# Patient Record
Sex: Male | Born: 1970 | Race: White | Hispanic: No | Smoking: Current every day smoker
Health system: Southern US, Community
[De-identification: ages and names within clinical notes are randomized; demographics above are authoritative.]

## PROBLEM LIST (undated history)

## (undated) DIAGNOSIS — J40 Bronchitis, not specified as acute or chronic: Secondary | ICD-10-CM

## (undated) DIAGNOSIS — G8929 Other chronic pain: Secondary | ICD-10-CM

## (undated) DIAGNOSIS — Z789 Other specified health status: Secondary | ICD-10-CM

## (undated) DIAGNOSIS — R112 Nausea with vomiting, unspecified: Secondary | ICD-10-CM

## (undated) DIAGNOSIS — K219 Gastro-esophageal reflux disease without esophagitis: Secondary | ICD-10-CM

## (undated) DIAGNOSIS — Z7289 Other problems related to lifestyle: Secondary | ICD-10-CM

## (undated) HISTORY — PX: OTHER SURGICAL HISTORY: SHX169

## (undated) HISTORY — DX: Gastro-esophageal reflux disease without esophagitis: K21.9

## (undated) HISTORY — DX: Bronchitis, not specified as acute or chronic: J40

---

## 2011-02-20 ENCOUNTER — Other Ambulatory Visit: Payer: Self-pay | Admitting: Occupational Medicine

## 2011-02-20 ENCOUNTER — Ambulatory Visit: Payer: Self-pay

## 2011-02-20 DIAGNOSIS — R52 Pain, unspecified: Secondary | ICD-10-CM

## 2015-10-08 ENCOUNTER — Encounter: Payer: Self-pay | Admitting: Family Medicine

## 2015-10-08 ENCOUNTER — Ambulatory Visit (INDEPENDENT_AMBULATORY_CARE_PROVIDER_SITE_OTHER): Payer: Self-pay | Admitting: Family Medicine

## 2015-10-08 VITALS — BP 104/88 | HR 77 | Temp 99.0°F | Resp 16 | Ht 71.5 in | Wt 207.0 lb

## 2015-10-08 DIAGNOSIS — J209 Acute bronchitis, unspecified: Secondary | ICD-10-CM

## 2015-10-08 MED ORDER — METHYLPREDNISOLONE ACETATE 80 MG/ML IJ SUSP
80.0000 mg | Freq: Once | INTRAMUSCULAR | Status: AC
  Administered 2015-10-08: 80 mg via INTRAMUSCULAR

## 2015-10-08 MED ORDER — AMOXICILLIN-POT CLAVULANATE 875-125 MG PO TABS: 1.0000 | ORAL_TABLET | Freq: Two times a day (BID) | ORAL | Status: DC

## 2015-10-08 NOTE — Progress Notes (Signed)
Patient ID: Ian Roy MRN: [id number], DOB: 1970-09-27, 45 y.o. Date of Encounter: 10/08/2015, 5:35 PM  By signing my name below, I, Judithe Modest, attest that this documentation has been prepared under the direction and in the presence of Robyn Haber, MD. Electronically Signed: Judithe Modest, ER Scribe. 10/08/2015. 5:29 PM.  Primary Physician: No primary care provider on file.  Chief Complaint  Patient presents with  . Cough    x 1 week with green mucus  . Wheezing   HPI: 45 y.o. year old male who presents with one week of cough, sneezing, fatigue, rhinorrhea, congestion, and wheezing for the last week. He states his sx started after working on his brother's house outside for several hours. Several years ago he had similar sx and was out of work for a month. He states he feels the same way that he did then. He denies chest pain. He denies fever. He is an every day smoker.   He works as a Quarry manager.   Past Medical History  Diagnosis Date  . Bronchitis      Home Meds: Prior to Admission medications   Not on File    Allergies: No Known Allergies  Social History   Social History  . Marital Status: Single    Spouse Name: N/A  . Number of Children: N/A  . Years of Education: N/A   Occupational History  . Not on file.   Social History Main Topics  . Smoking status: Current Every Day Smoker -- 1.00 packs/day for 14 years    Types: Cigarettes  . Smokeless tobacco: Not on file  . Alcohol Use: 0.0 oz/week    0 Standard drinks or equivalent per week     Comment: beer   . Drug Use: No  . Sexual Activity: Not on file   Other Topics Concern  . Not on file   Social History Narrative  . No narrative on file     Review of Systems: Constitutional: negative for chills, fever, night sweats, weight changes, or fatigue  HEENT: negative for vision changes, hearing loss, congestion, rhinorrhea, ST, epistaxis, or sinus pressure Cardiovascular: negative for chest  pain or palpitations Respiratory: negative for hemoptysis Abdominal: negative for abdominal pain, nausea, vomiting, diarrhea, or constipation Dermatological: negative for rash Neurologic: negative for headache, dizziness, or syncope All other systems reviewed and are otherwise negative with the exception to those above and in the HPI.   Physical Exam: Blood pressure 104/88, pulse 77, temperature 99 F (37.2 C), temperature source Oral, resp. rate 16, height 5' 11.5" (1.816 m), weight 207 lb (93.895 kg), SpO2 97 %., Body mass index is 28.47 kg/(m^2). General: Well developed, well nourished, in no acute distress. Head: Normocephalic, atraumatic, eyes without discharge, sclera non-icteric, nares are without discharge. Bilateral auditory canals clear, TM's are without perforation, pearly grey and translucent with reflective cone of light bilaterally. Oral cavity moist, posterior pharynx without exudate, erythema, peritonsillar abscess, or post nasal drip.  Neck: Supple. No thyromegaly. Full ROM. No lymphadenopathy. Lungs: Clear bilaterally to auscultation. Breathing is unlabored. Significant wheezing bilaterally Heart: RRR with S1 S2. No murmurs, rubs, or gallops appreciated. Abdomen: Soft, non-tender, non-distended with normoactive bowel sounds. No hepatomegaly. No rebound/guarding. No obvious abdominal masses. Msk:  Strength and tone normal for age. Extremities/Skin: Warm and dry. No clubbing or cyanosis. No edema. No rashes or suspicious lesions. Neuro: Alert and oriented X 3. Moves all extremities spontaneously. Gait is normal. CNII-XII grossly in tact. Psych:  Responds  to questions appropriately with a normal affect.     ASSESSMENT AND PLAN:  45 y.o. year old male with Acute bronchitis, unspecified organism - Plan: amoxicillin-clavulanate (AUGMENTIN) 875-125 MG tablet, methylPREDNISolone acetate (DEPO-MEDROL) injection 80 mg  This chart was scribed in my presence and reviewed by me  personally.   Signed, Robyn Haber, MD 10/08/2015 5:35 PM

## 2015-10-08 NOTE — Patient Instructions (Signed)

## 2015-10-18 ENCOUNTER — Ambulatory Visit: Payer: Self-pay | Admitting: Physician Assistant

## 2015-10-25 ENCOUNTER — Encounter: Payer: Self-pay | Admitting: Physician Assistant

## 2015-10-30 ENCOUNTER — Emergency Department (HOSPITAL_COMMUNITY): Payer: Self-pay

## 2015-10-30 ENCOUNTER — Encounter (HOSPITAL_COMMUNITY): Payer: Self-pay | Admitting: *Deleted

## 2015-10-30 ENCOUNTER — Emergency Department (HOSPITAL_COMMUNITY)
Admission: EM | Admit: 2015-10-30 | Discharge: 2015-10-30 | Disposition: A | Discharge status: Home / Self Care | Payer: Self-pay | Attending: Emergency Medicine | Admitting: Emergency Medicine

## 2015-10-30 DIAGNOSIS — F17210 Nicotine dependence, cigarettes, uncomplicated: Secondary | ICD-10-CM | POA: Insufficient documentation

## 2015-10-30 DIAGNOSIS — R1084 Generalized abdominal pain: Secondary | ICD-10-CM | POA: Insufficient documentation

## 2015-10-30 DIAGNOSIS — R109 Unspecified abdominal pain: Secondary | ICD-10-CM

## 2015-10-30 DIAGNOSIS — R197 Diarrhea, unspecified: Secondary | ICD-10-CM | POA: Insufficient documentation

## 2015-10-30 DIAGNOSIS — R112 Nausea with vomiting, unspecified: Secondary | ICD-10-CM | POA: Insufficient documentation

## 2015-10-30 HISTORY — DX: Other problems related to lifestyle: Z72.89

## 2015-10-30 HISTORY — DX: Other specified health status: Z78.9

## 2015-10-30 LAB — COMPREHENSIVE METABOLIC PANEL
ALT: 47 U/L (ref 17–63)
ANION GAP: 15 (ref 5–15)
AST: 41 U/L (ref 15–41)
Albumin: 4.7 g/dL (ref 3.5–5.0)
Alkaline Phosphatase: 54 U/L (ref 38–126)
BUN: 12 mg/dL (ref 6–20)
CALCIUM: 10 mg/dL (ref 8.9–10.3)
CHLORIDE: 96 mmol/L — AB (ref 101–111)
CO2: 24 mmol/L (ref 22–32)
CREATININE: 1.19 mg/dL (ref 0.61–1.24)
Glucose, Bld: 119 mg/dL — ABNORMAL HIGH (ref 65–99)
Potassium: 3.3 mmol/L — ABNORMAL LOW (ref 3.5–5.1)
SODIUM: 135 mmol/L (ref 135–145)
Total Bilirubin: 1.2 mg/dL (ref 0.3–1.2)
Total Protein: 8.6 g/dL — ABNORMAL HIGH (ref 6.5–8.1)

## 2015-10-30 LAB — URINALYSIS, ROUTINE W REFLEX MICROSCOPIC
Bilirubin Urine: NEGATIVE
Glucose, UA: NEGATIVE mg/dL
Hgb urine dipstick: NEGATIVE
Ketones, ur: 40 mg/dL — AB
LEUKOCYTES UA: NEGATIVE
Nitrite: NEGATIVE
PROTEIN: NEGATIVE mg/dL
pH: 8 (ref 5.0–8.0)

## 2015-10-30 LAB — CBC
HCT: 48.6 % (ref 39.0–52.0)
HEMOGLOBIN: 17.5 g/dL — AB (ref 13.0–17.0)
MCH: 32.3 pg (ref 26.0–34.0)
MCHC: 36 g/dL (ref 30.0–36.0)
MCV: 89.7 fL (ref 78.0–100.0)
PLATELETS: 260 10*3/uL (ref 150–400)
RBC: 5.42 MIL/uL (ref 4.22–5.81)
RDW: 12.9 % (ref 11.5–15.5)
WBC: 12.4 10*3/uL — AB (ref 4.0–10.5)

## 2015-10-30 LAB — LIPASE, BLOOD: LIPASE: 17 U/L (ref 11–51)

## 2015-10-30 LAB — ETHANOL

## 2015-10-30 MED ORDER — DIATRIZOATE MEGLUMINE & SODIUM 66-10 % PO SOLN
ORAL | Status: AC
  Administered 2015-10-30: 30 mL
  Filled 2015-10-30: qty 30

## 2015-10-30 MED ORDER — DICYCLOMINE HCL 10 MG/ML IM SOLN
20.0000 mg | Freq: Once | INTRAMUSCULAR | Status: AC
  Administered 2015-10-30: 20 mg via INTRAMUSCULAR
  Filled 2015-10-30: qty 2

## 2015-10-30 MED ORDER — DICYCLOMINE HCL 20 MG PO TABS: 20.0000 mg | ORAL_TABLET | Freq: Four times a day (QID) | ORAL | Status: DC | PRN

## 2015-10-30 MED ORDER — ONDANSETRON HCL 4 MG PO TABS: 4.0000 mg | ORAL_TABLET | Freq: Three times a day (TID) | ORAL | Status: DC | PRN

## 2015-10-30 MED ORDER — FAMOTIDINE IN NACL 20-0.9 MG/50ML-% IV SOLN
20.0000 mg | Freq: Once | INTRAVENOUS | Status: AC
  Administered 2015-10-30: 20 mg via INTRAVENOUS
  Filled 2015-10-30: qty 50

## 2015-10-30 MED ORDER — POTASSIUM CHLORIDE CRYS ER 20 MEQ PO TBCR
40.0000 meq | EXTENDED_RELEASE_TABLET | Freq: Once | ORAL | Status: AC
  Administered 2015-10-30: 40 meq via ORAL
  Filled 2015-10-30: qty 2

## 2015-10-30 MED ORDER — SODIUM CHLORIDE 0.9 % IV SOLN
INTRAVENOUS | Status: DC
  Administered 2015-10-30: 1000 mL via INTRAVENOUS

## 2015-10-30 MED ORDER — SODIUM CHLORIDE 0.9 % IV BOLUS (SEPSIS)
1000.0000 mL | Freq: Once | INTRAVENOUS | Status: AC
  Administered 2015-10-30: 1000 mL via INTRAVENOUS

## 2015-10-30 MED ORDER — METOCLOPRAMIDE HCL 5 MG/ML IJ SOLN
10.0000 mg | Freq: Once | INTRAMUSCULAR | Status: AC
  Administered 2015-10-30: 10 mg via INTRAVENOUS
  Filled 2015-10-30: qty 2

## 2015-10-30 MED ORDER — IOPAMIDOL (ISOVUE-300) INJECTION 61%
100.0000 mL | Freq: Once | INTRAVENOUS | Status: AC | PRN
  Administered 2015-10-30: 100 mL via INTRAVENOUS

## 2015-10-30 MED ORDER — FAMOTIDINE 20 MG PO TABS: 20.0000 mg | ORAL_TABLET | Freq: Two times a day (BID) | ORAL | Status: DC

## 2015-10-30 NOTE — ED Notes (Signed)
Pt c/o abd pain and emesis since 2300 last night, states abd pain off and on for months, diarrhea x 1 month per pt

## 2015-10-30 NOTE — Discharge Instructions (Signed)
Eat a bland diet, avoiding greasy, fatty, fried foods, as well as spicy and acidic foods or beverages.  Avoid eating within the hour or 2 before going to bed or laying down.  Also avoid teas, colas, coffee, chocolate, pepermint and spearment. Try to cut down (or quit) your alcohol intake.  May also take over the counter maalox/mylanta, as directed on packaging, as needed for discomfort.  Take the prescriptions as directed.  Call your regular medical doctor and the GI doctor on Monday to schedule a follow up appointment this week.  Return to the Emergency Department immediately if worsening.

## 2015-10-30 NOTE — ED Provider Notes (Signed)
CSN: [id number]     Arrival date & time 10/30/15  1450 History   First MD Initiated Contact with Patient 10/30/15 1511     Chief Complaint  Patient presents with  . Abdominal Pain      HPI  Pt was seen at 1520.  Per pt, c/o gradual onset and persistence of intermittent generalized abd "pain" for the past 6 months, worse over the past 1 to 2 months.  Has been associated with multiple intermittent episodes of N/V/D.  Describes the abd pain as "cramping," "bloating," and "aching." States he was on abx for dx bronchitis 3 weeks ago, but his symptoms began well before the course of antibiotics. Pt endorses daily etoh use. Pt has not sought medical treatment for his symptoms before today.  Denies fevers, no back pain, no rash, no CP/SOB, no black or blood in stools or emesis.      Past Medical History  Diagnosis Date  . Bronchitis   . Alcohol use ([place])     daily   History reviewed. No pertinent past surgical history.   Family History  Problem Relation Age of Onset  . Cancer Mother     breast  . Diabetes Mother   . Cancer Brother    Social History  Substance Use Topics  . Smoking status: Current Every Day Smoker -- 1.00 packs/day for 14 years    Types: Cigarettes  . Smokeless tobacco: None  . Alcohol Use: 0.0 oz/week    0 Standard drinks or equivalent per week     Comment: beer     Review of Systems ROS: Statement: All systems negative except as marked or noted in the HPI; Constitutional: Negative for fever and chills. ; ; Eyes: Negative for eye pain, redness and discharge. ; ; ENMT: Negative for ear pain, hoarseness, nasal congestion, sinus pressure and sore throat. ; ; Cardiovascular: Negative for chest pain, palpitations, diaphoresis, dyspnea and peripheral edema. ; ; Respiratory: Negative for cough, wheezing and stridor. ; ; Gastrointestinal: +N/V/D, abd pain. Negative for blood in stool, hematemesis, jaundice and rectal bleeding. . ; ; Genitourinary: Negative for dysuria, flank  pain and hematuria. ; ; Musculoskeletal: Negative for back pain and neck pain. Negative for swelling and trauma.; ; Skin: Negative for pruritus, rash, abrasions, blisters, bruising and skin lesion.; ; Neuro: Negative for headache, lightheadedness and neck stiffness. Negative for weakness, altered level of consciousness , altered mental status, extremity weakness, paresthesias, involuntary movement, seizure and syncope.     Allergies  Review of patient's allergies indicates no known allergies.  Home Medications   Prior to Admission medications   Medication Sig Start Date End Date Taking? Authorizing Provider  amoxicillin-clavulanate (AUGMENTIN) 875-125 MG tablet Take 1 tablet by mouth 2 (two) times daily. Patient not taking: Reported on 10/30/2015 10/08/15   Robyn Haber, MD   BP 120/80 mmHg  Pulse 88  Temp(Src) 98.3 F (36.8 C) (Oral)  Resp 18  Ht 6' (1.829 m)  Wt 205 lb (92.987 kg)  BMI 27.80 kg/m2  SpO2 100% Physical Exam  1525: Physical examination:  Nursing notes reviewed; Vital signs and O2 SAT reviewed;  Constitutional: Well developed, Well nourished, Well hydrated, In no acute distress; Head:  Normocephalic, atraumatic; Eyes: EOMI, PERRL, No scleral icterus; ENMT: Mouth and pharynx normal, Mucous membranes moist; Neck: Supple, Full range of motion, No lymphadenopathy; Cardiovascular: Regular rate and rhythm, No gallop; Respiratory: Breath sounds clear & equal bilaterally, No wheezes.  Speaking full sentences with ease, Normal respiratory effort/excursion;  Chest: Nontender, Movement normal; Abdomen: Soft, +diffuse tenderness to palp. No rebound or guarding. Nondistended, Normal bowel sounds; Genitourinary: No CVA tenderness; Extremities: Pulses normal, No tenderness, No edema, No calf edema or asymmetry.; Neuro: AA&Ox3, Major CN grossly intact.  Speech clear. No gross focal motor or sensory deficits in extremities.; Skin: Color normal, Warm, Dry.   ED Course  Procedures (including  critical care time) Labs Review  Imaging Review  I have personally reviewed and evaluated these images and lab results as part of my medical decision-making.   EKG Interpretation None      MDM  MDM Reviewed: previous chart, nursing note and vitals Reviewed previous: labs Interpretation: labs, x-ray and CT scan     Results for orders placed or performed during the hospital encounter of 10/30/15  Lipase, blood  Result Value Ref Range   Lipase 17 11 - 51 U/L  Comprehensive metabolic panel  Result Value Ref Range   Sodium 135 135 - 145 mmol/L   Potassium 3.3 (L) 3.5 - 5.1 mmol/L   Chloride 96 (L) 101 - 111 mmol/L   CO2 24 22 - 32 mmol/L   Glucose, Bld 119 (H) 65 - 99 mg/dL   BUN 12 6 - 20 mg/dL   Creatinine, Ser 1.19 0.61 - 1.24 mg/dL   Calcium 10.0 8.9 - 10.3 mg/dL   Total Protein 8.6 (H) 6.5 - 8.1 g/dL   Albumin 4.7 3.5 - 5.0 g/dL   AST 41 15 - 41 U/L   ALT 47 17 - 63 U/L   Alkaline Phosphatase 54 38 - 126 U/L   Total Bilirubin 1.2 0.3 - 1.2 mg/dL   GFR calc non Af Amer >60 >60 mL/min   GFR calc Af Amer >60 >60 mL/min   Anion gap 15 5 - 15  CBC  Result Value Ref Range   WBC 12.4 (H) 4.0 - 10.5 K/uL   RBC 5.42 4.22 - 5.81 MIL/uL   Hemoglobin 17.5 (H) 13.0 - 17.0 g/dL   HCT 48.6 39.0 - 52.0 %   MCV 89.7 78.0 - 100.0 fL   MCH 32.3 26.0 - 34.0 pg   MCHC 36.0 30.0 - 36.0 g/dL   RDW 12.9 11.5 - 15.5 %   Platelets 260 150 - 400 K/uL  Ethanol  Result Value Ref Range   Alcohol, Ethyl (B) <5 <5 mg/dL   Dg Chest 2 View 10/30/2015  CLINICAL DATA:  Epigastric pain for several months with nausea and vomiting for 1 today EXAM: CHEST  2 VIEW COMPARISON:  None. FINDINGS: The heart size and mediastinal contours are within normal limits. Both lungs are clear. The visualized skeletal structures are unremarkable. IMPRESSION: No active cardiopulmonary disease. Electronically Signed   By: Inez Catalina M.D.   On: 10/30/2015 17:26   Ct Abdomen Pelvis W Contrast 10/30/2015  CLINICAL  DATA:  Epigastric pain and diarrhea for several months. Nausea and vomiting since yesterday. EXAM: CT ABDOMEN AND PELVIS WITH CONTRAST TECHNIQUE: Multidetector CT imaging of the abdomen and pelvis was performed using the standard protocol following bolus administration of intravenous contrast. CONTRAST:  142mL ISOVUE-300 IOPAMIDOL (ISOVUE-300) INJECTION 61% COMPARISON:  None. FINDINGS: Lower chest:  No acute findings. Hepatobiliary: No masses or other significant abnormality. Gallbladder is unremarkable. Pancreas: No mass, inflammatory changes, or other significant abnormality. Spleen: Within normal limits in size and appearance. Adrenals/Urinary Tract: No masses identified. No evidence of hydronephrosis. Stomach/Bowel: Small hiatal hernia noted. No evidence of obstruction, inflammatory process, or abnormal fluid collections. Normal appendix  visualized. Vascular/Lymphatic: No pathologically enlarged lymph nodes. No evidence of abdominal aortic aneurysm. Reproductive: No mass or other significant abnormality. Other: None. Musculoskeletal:  No suspicious bone lesions identified. IMPRESSION: No acute findings within the abdomen or pelvis. Small hiatal hernia. Electronically Signed   By: Earle Gell M.D.   On: 10/30/2015 17:25    1815:  Potassium repleted PO. Pt has tol PO well while in the ED without N/V.  No stooling while in the ED.  Abd benign, VSS. Feels better and wants to go home now. Tx symptomatically, f/u GI MD. Dx and testing d/w pt and family.  Questions answered.  Verb understanding, agreeable to d/c home with outpt f/u.     Francine Graven, DO 11/03/15 1451

## 2015-11-02 ENCOUNTER — Ambulatory Visit: Payer: Self-pay | Admitting: Physician Assistant

## 2015-11-02 ENCOUNTER — Other Ambulatory Visit: Payer: Self-pay | Admitting: Physician Assistant

## 2015-11-02 ENCOUNTER — Encounter: Payer: Self-pay | Admitting: Internal Medicine

## 2015-11-02 ENCOUNTER — Encounter: Payer: Self-pay | Admitting: Physician Assistant

## 2015-11-02 VITALS — BP 112/68 | HR 70 | Temp 97.7°F | Ht 71.5 in | Wt 199.2 lb

## 2015-11-02 DIAGNOSIS — F1010 Alcohol abuse, uncomplicated: Secondary | ICD-10-CM | POA: Insufficient documentation

## 2015-11-02 DIAGNOSIS — K219 Gastro-esophageal reflux disease without esophagitis: Secondary | ICD-10-CM

## 2015-11-02 DIAGNOSIS — R197 Diarrhea, unspecified: Secondary | ICD-10-CM

## 2015-11-02 DIAGNOSIS — F17210 Nicotine dependence, cigarettes, uncomplicated: Secondary | ICD-10-CM | POA: Insufficient documentation

## 2015-11-02 DIAGNOSIS — Z131 Encounter for screening for diabetes mellitus: Secondary | ICD-10-CM

## 2015-11-02 DIAGNOSIS — Z13220 Encounter for screening for lipoid disorders: Secondary | ICD-10-CM

## 2015-11-02 DIAGNOSIS — R112 Nausea with vomiting, unspecified: Secondary | ICD-10-CM

## 2015-11-02 HISTORY — DX: Nicotine dependence, cigarettes, uncomplicated: F17.210

## 2015-11-02 HISTORY — DX: Alcohol abuse, uncomplicated: F10.10

## 2015-11-02 MED ORDER — PROMETHAZINE HCL 25 MG RE SUPP: 25.0000 mg | Freq: Four times a day (QID) | RECTAL | Status: DC | PRN

## 2015-11-02 MED ORDER — PROMETHAZINE HCL 25 MG PO TABS: 25.0000 mg | ORAL_TABLET | Freq: Three times a day (TID) | ORAL | Status: DC | PRN

## 2015-11-02 NOTE — Progress Notes (Signed)
BP 112/68 mmHg  Pulse 70  Temp(Src) 97.7 F (36.5 C)  Ht 5' 11.5" (1.816 m)  Wt 199 lb 3.2 oz (90.357 kg)  BMI 27.40 kg/m2  SpO2 97%   Subjective:    Patient ID: Ian Roy, male    DOB: 1971/06/05, 45 y.o.   MRN: [id number]  HPI: Ian Roy is a 45 y.o. male presenting on 11/02/2015 for New Patient (Initial Visit)   HPI  Pt's mother is in with him today  Pt started feeling bad in march. He says he was Dx with bronchitis.  Got steroid shot and antibiotic which he did not finish/only took for a few days due to stomach irritation (augmentin) .   He felt better until middle of this month when he got stomach problems.  He denies coughing.    He says he had no emesis from 6pm last night - 10 am today.  He drank gatorade this am and started again with the emesis.  Pt c/o diarrhea for a month.  He states this is constant and not intermitttent.  Number of daily episodes depends- usually 2-4.  Pt states he drinks 12pk beer/night.  This was nightly until he began with the stomach pain a month ago and since that time it has been "random",  based on how he feels.  Notes from recent ER visit reviewed including benign CT abd and CXR    Relevant past medical, surgical, family and social history reviewed and updated as indicated. Interim medical history since our last visit reviewed. Allergies and medications reviewed and updated.  CURRENT MEDS: Reviewed but pt hostile and not overly helpful.  Says I am to count his pills to see what he is taking.  Says the "f" medicine seems to help him the most  Review of Systems  Constitutional: Positive for chills, diaphoresis, appetite change and fatigue. Negative for fever and unexpected weight change.  HENT: Positive for congestion and sneezing. Negative for dental problem, drooling, ear pain, facial swelling, hearing loss, mouth sores, sore throat, trouble swallowing and voice change.   Eyes: Positive for redness and itching. Negative  for pain, discharge and visual disturbance.  Respiratory: Positive for cough, choking and shortness of breath. Negative for wheezing.   Cardiovascular: Negative for chest pain, palpitations and leg swelling.  Gastrointestinal: Positive for vomiting, abdominal pain and diarrhea. Negative for constipation and blood in stool.  Endocrine: Negative for cold intolerance, heat intolerance and polydipsia.  Genitourinary: Negative for dysuria, hematuria and decreased urine volume.  Musculoskeletal: Negative for back pain, arthralgias and gait problem.  Skin: Negative for rash.  Allergic/Immunologic: Negative for environmental allergies.  Neurological: Positive for headaches. Negative for seizures, syncope and light-headedness.  Hematological: Negative for adenopathy.  Psychiatric/Behavioral: Negative for suicidal ideas, dysphoric mood and agitation. The patient is not nervous/anxious.     Per HPI unless specifically indicated above     Objective:    BP 112/68 mmHg  Pulse 70  Temp(Src) 97.7 F (36.5 C)  Ht 5' 11.5" (1.816 m)  Wt 199 lb 3.2 oz (90.357 kg)  BMI 27.40 kg/m2  SpO2 97%  Wt Readings from Last 3 Encounters:  11/02/15 199 lb 3.2 oz (90.357 kg)  10/30/15 205 lb (92.987 kg)  10/08/15 207 lb (93.895 kg)    Physical Exam  Constitutional: He is oriented to person, place, and time. He appears well-developed and well-nourished.  Pt flailing around the room, all over the exam table and the entire room, retching repeatedly -although  emesis bag only has spit in it- no vomitus or bilious material- rather incessantly.  This mysteriously stops when the pt becomes interested in answering specifically a question more in depth.  The retching resumes after he looses focus on his answer.  HENT:  Head: Normocephalic and atraumatic.  Neck: Neck supple.  Cardiovascular: Normal rate and regular rhythm.   Pulmonary/Chest: Effort normal and breath sounds normal. He has no wheezes.  Abdominal: Soft.  Bowel sounds are normal. He exhibits no distension and no mass. There is no hepatosplenomegaly. There is no tenderness. There is no rebound and no guarding.  Lymphadenopathy:    He has no cervical adenopathy.  Neurological: He is alert and oriented to person, place, and time.  Skin: Skin is warm and dry.  Psychiatric: His affect is angry, labile and inappropriate. He is agitated and aggressive.  Vitals reviewed.       Assessment & Plan:   Encounter Diagnoses  Name Primary?  . Non-intractable vomiting with nausea, vomiting of unspecified type Yes  . Diarrhea, unspecified type   . Gastroesophageal reflux disease, esophagitis presence not specified   . Alcohol abuse   . Cigarette nicotine dependence without complication   . Screening cholesterol level   . Screening for diabetes mellitus     -Pt to get labs drawn this afternoon.  Electrolytes need to be checked. -he is given rx for phenergan and counseled to avoid driving on the medication and absolutely NO EtOH with this medication -sent container to check stool for c dif toxin -pt likely will need GI referral but will attempt to discuss this at f/u OV in the hopes that he will be more willing to sit and discuss -f/u OV 2 days

## 2015-11-02 NOTE — Patient Instructions (Signed)

## 2015-11-03 LAB — LIPID PANEL
CHOL/HDL RATIO: 3.6 ratio (ref <=5.0)
Cholesterol: 169 mg/dL (ref 125–200)
HDL: 47 mg/dL (ref >=40)
LDL Cholesterol: 61 mg/dL (ref <130)
TRIGLYCERIDES: 307 mg/dL — AB (ref <150)
VLDL: 61 mg/dL — ABNORMAL HIGH (ref <30)

## 2015-11-03 LAB — BASIC METABOLIC PANEL
BUN: 8 mg/dL (ref 7–25)
CALCIUM: 9.5 mg/dL (ref 8.6–10.3)
CO2: 22 mmol/L (ref 20–31)
CREATININE: 1.07 mg/dL (ref 0.60–1.35)
Chloride: 98 mmol/L (ref 98–110)
GLUCOSE: 97 mg/dL (ref 65–99)
POTASSIUM: 3.4 mmol/L — AB (ref 3.5–5.3)
Sodium: 137 mmol/L (ref 135–146)

## 2015-11-03 LAB — HEMOGLOBIN A1C
Hgb A1c MFr Bld: 5.4 % (ref <5.7)
MEAN PLASMA GLUCOSE: 108 mg/dL

## 2015-11-04 ENCOUNTER — Encounter: Payer: Self-pay | Admitting: Physician Assistant

## 2015-11-04 ENCOUNTER — Ambulatory Visit: Payer: Self-pay | Admitting: Physician Assistant

## 2015-11-04 VITALS — BP 106/78 | HR 87 | Temp 98.1°F | Ht 71.5 in

## 2015-11-04 DIAGNOSIS — K219 Gastro-esophageal reflux disease without esophagitis: Secondary | ICD-10-CM

## 2015-11-04 DIAGNOSIS — R110 Nausea: Secondary | ICD-10-CM

## 2015-11-04 DIAGNOSIS — R197 Diarrhea, unspecified: Secondary | ICD-10-CM

## 2015-11-04 DIAGNOSIS — F17210 Nicotine dependence, cigarettes, uncomplicated: Secondary | ICD-10-CM

## 2015-11-04 DIAGNOSIS — R112 Nausea with vomiting, unspecified: Secondary | ICD-10-CM

## 2015-11-04 DIAGNOSIS — F1010 Alcohol abuse, uncomplicated: Secondary | ICD-10-CM

## 2015-11-04 MED ORDER — PROMETHAZINE HCL 25 MG/ML IJ SOLN: 25.0000 mg | Freq: Once | INTRAMUSCULAR | Status: DC

## 2015-11-04 MED ORDER — PROMETHAZINE HCL 50 MG/ML IJ SOLN
25.0000 mg | Freq: Once | INTRAMUSCULAR | Status: AC
  Administered 2015-11-04: 25 mg via INTRAMUSCULAR

## 2015-11-04 MED ORDER — DICYCLOMINE HCL 20 MG PO TABS: 20.0000 mg | ORAL_TABLET | Freq: Four times a day (QID) | ORAL | Status: DC | PRN

## 2015-11-04 MED ORDER — PROMETHAZINE HCL 25 MG PO TABS: 25.0000 mg | ORAL_TABLET | Freq: Three times a day (TID) | ORAL | Status: DC | PRN

## 2015-11-04 NOTE — Patient Instructions (Addendum)
Get blood drawn on Monday Call Monday to let us know if you are improved

## 2015-11-04 NOTE — Progress Notes (Signed)
BP 106/78 mmHg  Pulse 87  Temp(Src) 98.1 F (36.7 C)  Ht 5' 11.5" (1.816 m)  SpO2 99%   Subjective:    Patient ID: Ian Roy, male    DOB: Nov 07, 1970, 45 y.o.   MRN: [id number]  HPI: Ian Roy is a 45 y.o. male presenting on 11/04/2015 for Emesis; Diarrhea; and Abdominal Pain   HPI    Pt with long-standing hx alcoholism- drinks 12 beer/day.  Now with "emesis" since Friday last week.   Pt is still retching same as he was on 11/02/15 (see office note) with episodes of note retching when he gets focused on talking/answering a question in depth followed by flailing around the exam room with dramatic retching.  No vomiting blood.  Pt did not turn in stool to lab as directed but brought it to office with him today.  Relevant past medical, surgical, family and social history reviewed and updated as indicated. Interim medical history since our last visit reviewed. Allergies and medications reviewed and updated.  Current outpatient prescriptions:  .  famotidine (PEPCID) 20 MG tablet, Take 1 tablet (20 mg total) by mouth 2 (two) times daily., Disp: 30 tablet, Rfl: 0 .  Phosphorated Carbohyd-Caff SYRP, Take by mouth daily as needed. Reported on 11/04/2015, Disp: , Rfl:  .  promethazine (PHENERGAN) 25 MG tablet, Take 1 tablet (25 mg total) by mouth every 8 (eight) hours as needed for nausea or vomiting., Disp: 10 tablet, Rfl: 0 .  promethazine (PROMETHEGAN) 25 MG suppository, Place 1 suppository (25 mg total) rectally every 6 (six) hours as needed for nausea or vomiting. (Patient not taking: Reported on 11/04/2015), Disp: 10 each, Rfl: 0  Current facility-administered medications:  .  promethazine (PHENERGAN) injection 25 mg, 25 mg, Intramuscular, Once, Soyla Dryer, PA-C  Review of Systems  Unable to perform ROS: Other  Gastrointestinal: Positive for nausea, vomiting, abdominal pain and diarrhea.    Per HPI unless specifically indicated above     Objective:    BP  106/78 mmHg  Pulse 87  Temp(Src) 98.1 F (36.7 C)  Ht 5' 11.5" (1.816 m)  SpO2 99%  Wt Readings from Last 3 Encounters:  11/02/15 199 lb 3.2 oz (90.357 kg)  10/30/15 205 lb (92.987 kg)  10/08/15 207 lb (93.895 kg)    Physical Exam  Constitutional: He is oriented to person, place, and time. He appears well-developed and well-nourished.  HENT:  Head: Normocephalic and atraumatic.  Neck: Neck supple.  Cardiovascular: Normal rate and regular rhythm.   Pulmonary/Chest: Effort normal and breath sounds normal. He has no wheezes.  Abdominal: Soft. Bowel sounds are normal. He exhibits no distension and no mass. There is no hepatosplenomegaly. There is no tenderness. There is no rigidity, no rebound and no guarding.  Distracting pt with questioning during exam allows for entirely benign abdominal exam with no tenderness or mass on deep palpation.  Prior to exam on table, pt retching violently with nothing in basin except for small amount of saliva.  During exam on table, no retching or effort to have "emesis"  Lymphadenopathy:    He has no cervical adenopathy.  Neurological: He is alert and oriented to person, place, and time.  Skin: Skin is warm and dry.  Psychiatric: His affect is inappropriate.  Pt flailing around exam room. Question histrionic behavior  Vitals reviewed.   Results for orders placed or performed in visit on 0000000  Basic Metabolic Panel (BMET)  Result Value Ref Range   Sodium  137 135 - 146 mmol/L   Potassium 3.4 (L) 3.5 - 5.3 mmol/L   Chloride 98 98 - 110 mmol/L   CO2 22 20 - 31 mmol/L   Glucose, Bld 97 65 - 99 mg/dL   BUN 8 7 - 25 mg/dL   Creat 1.07 0.60 - 1.35 mg/dL   Calcium 9.5 8.6 - 10.3 mg/dL  Lipid Profile  Result Value Ref Range   Cholesterol 169 125 - 200 mg/dL   Triglycerides 307 (H) <150 mg/dL   HDL 47 >=40 mg/dL   Total CHOL/HDL Ratio 3.6 <=5.0 Ratio   VLDL 61 (H) <30 mg/dL   LDL Cholesterol 61 <130 mg/dL  HgB A1c  Result Value Ref Range   Hgb  A1c MFr Bld 5.4 <5.7 %   Mean Plasma Glucose 108 mg/dL      Assessment & Plan:   Encounter Diagnoses  Name Primary?  . Nausea and vomiting, vomiting of unspecified type Yes  . Gastroesophageal reflux disease, esophagitis presence not specified   . Alcohol abuse   . Cigarette nicotine dependence without complication   . Diarrhea, unspecified type   . Nausea     -pt given phenergan 25mg  IM in office with no effect or change in retching behavior (pt's mother with him today- she is driving) -Gave [employer] discount application -Pt to take stool to lab for c dif testing -Recheck bmp Monday. -Refill phenergan given to pt -pt to notify office on Monday if he is improved.  If he is still retching,will need to be referred to GI -f/u appointment one month

## 2015-11-05 LAB — C. DIFFICILE GDH AND TOXIN A/B
C. DIFF TOXIN A/B: NOT DETECTED
C. difficile GDH: NOT DETECTED

## 2015-11-07 DIAGNOSIS — R1110 Vomiting, unspecified: Secondary | ICD-10-CM | POA: Insufficient documentation

## 2015-11-07 DIAGNOSIS — R197 Diarrhea, unspecified: Secondary | ICD-10-CM | POA: Insufficient documentation

## 2015-11-07 DIAGNOSIS — K219 Gastro-esophageal reflux disease without esophagitis: Secondary | ICD-10-CM | POA: Insufficient documentation

## 2015-11-08 ENCOUNTER — Encounter (HOSPITAL_COMMUNITY): Payer: Self-pay | Admitting: Emergency Medicine

## 2015-11-08 ENCOUNTER — Encounter: Payer: Self-pay | Admitting: Physician Assistant

## 2015-11-08 ENCOUNTER — Emergency Department (HOSPITAL_COMMUNITY): Payer: Self-pay

## 2015-11-08 ENCOUNTER — Emergency Department (HOSPITAL_COMMUNITY)
Admission: EM | Admit: 2015-11-08 | Discharge: 2015-11-08 | Disposition: A | Discharge status: Home / Self Care | Payer: Self-pay | Attending: Emergency Medicine | Admitting: Emergency Medicine

## 2015-11-08 DIAGNOSIS — K2970 Gastritis, unspecified, without bleeding: Secondary | ICD-10-CM | POA: Insufficient documentation

## 2015-11-08 DIAGNOSIS — R112 Nausea with vomiting, unspecified: Secondary | ICD-10-CM

## 2015-11-08 DIAGNOSIS — Z79899 Other long term (current) drug therapy: Secondary | ICD-10-CM | POA: Insufficient documentation

## 2015-11-08 DIAGNOSIS — F17210 Nicotine dependence, cigarettes, uncomplicated: Secondary | ICD-10-CM | POA: Insufficient documentation

## 2015-11-08 LAB — COMPREHENSIVE METABOLIC PANEL
ALT: 61 U/L (ref 17–63)
ANION GAP: 14 (ref 5–15)
AST: 43 U/L — ABNORMAL HIGH (ref 15–41)
Albumin: 4.7 g/dL (ref 3.5–5.0)
Alkaline Phosphatase: 49 U/L (ref 38–126)
BUN: 17 mg/dL (ref 6–20)
CHLORIDE: 97 mmol/L — AB (ref 101–111)
CO2: 28 mmol/L (ref 22–32)
Calcium: 10.2 mg/dL (ref 8.9–10.3)
Creatinine, Ser: 1.35 mg/dL — ABNORMAL HIGH (ref 0.61–1.24)
Glucose, Bld: 109 mg/dL — ABNORMAL HIGH (ref 65–99)
POTASSIUM: 3.1 mmol/L — AB (ref 3.5–5.1)
Sodium: 139 mmol/L (ref 135–145)
Total Bilirubin: 0.9 mg/dL (ref 0.3–1.2)
Total Protein: 8.5 g/dL — ABNORMAL HIGH (ref 6.5–8.1)

## 2015-11-08 LAB — CBC
HEMATOCRIT: 50.6 % (ref 39.0–52.0)
Hemoglobin: 17.9 g/dL — ABNORMAL HIGH (ref 13.0–17.0)
MCH: 32.1 pg (ref 26.0–34.0)
MCHC: 35.4 g/dL (ref 30.0–36.0)
MCV: 90.8 fL (ref 78.0–100.0)
PLATELETS: 350 10*3/uL (ref 150–400)
RBC: 5.57 MIL/uL (ref 4.22–5.81)
RDW: 12.7 % (ref 11.5–15.5)
WBC: 10.1 10*3/uL (ref 4.0–10.5)

## 2015-11-08 LAB — LIPASE, BLOOD: LIPASE: 16 U/L (ref 11–51)

## 2015-11-08 LAB — MAGNESIUM: Magnesium: 2.3 mg/dL (ref 1.7–2.4)

## 2015-11-08 MED ORDER — SCOPOLAMINE 1 MG/3DAYS TD PT72
1.0000 | MEDICATED_PATCH | TRANSDERMAL | Status: DC
  Administered 2015-11-08: 1.5 mg via TRANSDERMAL
  Filled 2015-11-08: qty 1

## 2015-11-08 MED ORDER — OMEPRAZOLE 20 MG PO CPDR: 20.0000 mg | DELAYED_RELEASE_CAPSULE | Freq: Two times a day (BID) | ORAL | Status: DC

## 2015-11-08 MED ORDER — SODIUM CHLORIDE 0.9 % IV BOLUS (SEPSIS)
2000.0000 mL | Freq: Once | INTRAVENOUS | Status: AC
  Administered 2015-11-08: 2000 mL via INTRAVENOUS

## 2015-11-08 MED ORDER — POTASSIUM CHLORIDE 10 MEQ/100ML IV SOLN
10.0000 meq | INTRAVENOUS | Status: AC
  Administered 2015-11-08 (×2): 10 meq via INTRAVENOUS
  Filled 2015-11-08 (×2): qty 100

## 2015-11-08 MED ORDER — ONDANSETRON HCL 4 MG/2ML IJ SOLN
4.0000 mg | Freq: Once | INTRAMUSCULAR | Status: AC
  Administered 2015-11-08: 4 mg via INTRAVENOUS
  Filled 2015-11-08: qty 2

## 2015-11-08 MED ORDER — SCOPOLAMINE 1 MG/3DAYS TD PT72
MEDICATED_PATCH | TRANSDERMAL | Status: AC
  Filled 2015-11-08: qty 1

## 2015-11-08 MED ORDER — PANTOPRAZOLE SODIUM 40 MG IV SOLR
40.0000 mg | Freq: Once | INTRAVENOUS | Status: AC
  Administered 2015-11-08: 40 mg via INTRAVENOUS
  Filled 2015-11-08: qty 40

## 2015-11-08 MED ORDER — DIPHENHYDRAMINE HCL 50 MG/ML IJ SOLN
25.0000 mg | Freq: Once | INTRAMUSCULAR | Status: AC
  Administered 2015-11-08: 25 mg via INTRAVENOUS
  Filled 2015-11-08: qty 1

## 2015-11-08 MED ORDER — HALOPERIDOL LACTATE 5 MG/ML IJ SOLN
2.0000 mg | Freq: Once | INTRAMUSCULAR | Status: AC
  Administered 2015-11-08: 2 mg via INTRAVENOUS
  Filled 2015-11-08: qty 1

## 2015-11-08 MED ORDER — ONDANSETRON 4 MG PO TBDP: 4.0000 mg | ORAL_TABLET | Freq: Three times a day (TID) | ORAL | Status: DC | PRN

## 2015-11-08 MED ORDER — MAGNESIUM SULFATE 2 GM/50ML IV SOLN
2.0000 g | Freq: Once | INTRAVENOUS | Status: AC
  Administered 2015-11-08: 2 g via INTRAVENOUS
  Filled 2015-11-08: qty 50

## 2015-11-08 MED ORDER — ONDANSETRON 4 MG PO TBDP
4.0000 mg | ORAL_TABLET | Freq: Once | ORAL | Status: AC
  Administered 2015-11-08: 4 mg via ORAL
  Filled 2015-11-08: qty 1

## 2015-11-08 MED ORDER — SCOPOLAMINE 1 MG/3DAYS TD PT72: 1.0000 | MEDICATED_PATCH | TRANSDERMAL | Status: DC

## 2015-11-08 NOTE — ED Notes (Signed)
Reminded patient that urine specimen is needed. Patient arrived at ED @ 1347 states that no one told him that is was required. States that he hasn't drank or ate anything in about a week. States that what little he did void was very orange in color and smelled.

## 2015-11-08 NOTE — ED Notes (Addendum)
Pt reports LT sided abdominal pain and vomiting x 2 weeks. Pt seen in ED over a week ago and was told to follow up with specialist. Pt reports specialist is booked for a month. States he cannot keep anything down. Pt reports decrease in bowel movements and urination over the past week.

## 2015-11-08 NOTE — Discharge Instructions (Signed)
Absolutely no alcohol, smoking, caffeine, or anti-inflammatory medicine such as aspirin/Motrin/Advil. Clear liquids only tonight. Slowly advance diet to solid foods after tolerating liquids for 24 hours.    Gastritis, Adult Gastritis is soreness and puffiness (inflammation) of the lining of the stomach. If you do not get help, gastritis can cause bleeding and sores (ulcers) in the stomach. HOME CARE   Only take medicine as told by your doctor.  If you were given antibiotic medicines, take them as told. Finish the medicines even if you start to feel better.  Drink enough fluids to keep your pee (urine) clear or pale yellow.  Avoid foods and drinks that make your problems worse. Foods you may want to avoid include:  Caffeine or alcohol.  Chocolate.  Mint.  Garlic and onions.  Spicy foods.  Citrus fruits, including oranges, lemons, or limes.  Food containing tomatoes, including sauce, chili, salsa, and pizza.  Fried and fatty foods.  Eat small meals throughout the day instead of large meals. GET HELP RIGHT AWAY IF:   You have black or dark red poop (stools).  You throw up (vomit) blood. It may look like coffee grounds.  You cannot keep fluids down.  Your belly (abdominal) pain gets worse.  You have a fever.  You do not feel better after 1 week.  You have any other questions or concerns. MAKE SURE YOU:   Understand these instructions.  Will watch your condition.  Will get help right away if you are not doing well or get worse.   This information is not intended to replace advice given to you by your health care provider. Make sure you discuss any questions you have with your health care provider.   Document Released: 12/13/2007 Document Revised: 09/18/2011 Document Reviewed: 08/09/2011 Elsevier Interactive Patient Education [employer].

## 2015-11-08 NOTE — ED Notes (Signed)
Assumed care of patient from [place], [place]. Pt awaiting radiology studies and results. Mother at side. Pt agressively loud, hyper verbal, encouraged to remain quiet while in the ED.

## 2015-11-08 NOTE — ED Notes (Signed)
Patient transported to X-ray 

## 2015-11-08 NOTE — Progress Notes (Signed)
Pt came in office stating he was not feeling better after being seen at our office on Thursday morning. Patient was advised to go to the [facility] Per [facility]rene [place] for treatment.

## 2015-11-08 NOTE — ED Notes (Signed)
Went to go give pt zofran. Pt states, "that shit don't work." Informed patient that he did not have to take medication. Pt stated, "can you give me a suppository." Stated that we did not have zofran suppositories. Pt requesting water. Denied at this time since patient is dry heaving.

## 2015-11-08 NOTE — ED Provider Notes (Signed)
CSN: [id number]     Arrival date & time 11/08/15  1341 History   First MD Initiated Contact with Patient 11/08/15 1820     Chief Complaint  Patient presents with  . Abdominal Pain     HPI  History evaluation of persistent vomiting.  He relates his symptoms to a visit to urgent care for bronchitis on March 31. It sounds like he was rather insistent upon given antibiotics, and he was given Augmentin for 10 days. He states within a few days he had some abdominal discomfort. States "that Shit tore me up". Further states that this felt like it was making him cramp nauseated and he had no appetite. Seen here April 22. Normal CT scan and laboratory testing was treated for his nausea and vomiting with antiemetics and proton pump inhibitors. Discharge with Pepcid, Bentyl, and Zofran. Has had persistent symptoms since that time. Relates these lost 10 pounds. [facility] Has had outpatient testing including a negative C. difficile, and pending Giardia test.  States he has continued episodes of waves of discomfort with nausea and epigastric pain. States even Jell-O is coming up. His of had a bowel movement other than "small amounts" for more than a week. Was a previous daily alcohol user, has not had any alcohol since his evaluation here 4/22. Denies marijuana use. Daily smoker. Does not use anti-inflammatories.  Past Medical History  Diagnosis Date  . Bronchitis   . Alcohol use (HCC)     daily  . GERD (gastroesophageal reflux disease)    History reviewed. No pertinent past surgical history. Family History  Problem Relation Age of Onset  . Cancer Mother     breast  . Diabetes Mother   . Cancer Maternal Aunt    Social History  Substance Use Topics  . Smoking status: Current Every Day Smoker -- 1.00 packs/day for 14 years    Types: Cigarettes  . Smokeless tobacco: Never Used  . Alcohol Use: 50.4 oz/week    84 Cans of beer, 0 Standard drinks or equivalent per week   Comment: 12 beer / day    Review of Systems  Constitutional: Negative for fever, chills, diaphoresis, appetite change and fatigue.  HENT: Negative for mouth sores, sore throat and trouble swallowing.   Eyes: Negative for visual disturbance.  Respiratory: Negative for cough, chest tightness, shortness of breath and wheezing.   Cardiovascular: Negative for chest pain.  Gastrointestinal: Positive for nausea, vomiting and abdominal distention. Negative for abdominal pain and diarrhea.  Endocrine: Negative for polydipsia, polyphagia and polyuria.  Genitourinary: Negative for dysuria, frequency and hematuria.  Musculoskeletal: Negative for gait problem.  Skin: Negative for color change, pallor and rash.  Neurological: Negative for dizziness, syncope, light-headedness and headaches.  Hematological: Does not bruise/bleed easily.  Psychiatric/Behavioral: Negative for behavioral problems and confusion.      Allergies  Review of patient's allergies indicates no known allergies.  Home Medications   Prior to Admission medications   Medication Sig Start Date End Date Taking? Authorizing Provider  dicyclomine (BENTYL) 20 MG tablet Take 1 tablet (20 mg total) by mouth every 6 (six) hours as needed for spasms. 11/04/15  Yes Soyla Dryer, PA-C  famotidine (PEPCID) 20 MG tablet Take 1 tablet (20 mg total) by mouth 2 (two) times daily. 10/30/15  Yes Francine Graven, DO  Ginger, Zingiber officinalis, (GINGER ROOT PO) Take by mouth once as needed (grated and mixed for drinking).   Yes Historical Provider, MD  promethazine (  PHENERGAN) 25 MG tablet Take 1 tablet (25 mg total) by mouth every 8 (eight) hours as needed for nausea or vomiting. 11/04/15  Yes Soyla Dryer, PA-C  omeprazole (PRILOSEC) 20 MG capsule Take 1 capsule (20 mg total) by mouth 2 (two) times daily. 11/08/15   Tanna Furry, MD  ondansetron (ZOFRAN ODT) 4 MG disintegrating tablet Take 1 tablet (4 mg total) by mouth every 8 (eight) hours as  needed for nausea. 11/08/15   Tanna Furry, MD  promethazine (PROMETHEGAN) 25 MG suppository Place 1 suppository (25 mg total) rectally every 6 (six) hours as needed for nausea or vomiting. Patient not taking: Reported on 11/04/2015 11/02/15   Soyla Dryer, PA-C  scopolamine (TRANSDERM-SCOP, 1.5 MG,) 1 MG/3DAYS Place 1 patch (1.5 mg total) onto the skin every 3 (three) days. 11/08/15   Tanna Furry, MD   BP 127/84 mmHg  Pulse 73  Temp(Src) 98.3 F (36.8 C) (Oral)  Resp 20  Ht 6' (1.829 m)  Wt 196 lb (88.905 kg)  BMI 26.58 kg/m2  SpO2 100% Physical Exam  Constitutional: He is oriented to person, place, and time. He appears well-developed and well-nourished. No distress.  HENT:  Head: Normocephalic.  Eyes: Conjunctivae are normal. Pupils are equal, round, and reactive to light. No scleral icterus.  Neck: Normal range of motion. Neck supple. No thyromegaly present.  Cardiovascular: Normal rate and regular rhythm.  Exam reveals no gallop and no friction rub.   No murmur heard. Pulmonary/Chest: Effort normal and breath sounds normal. No respiratory distress. He has no wheezes. He has no rales.  Abdominal: Soft. He exhibits no distension. Bowel sounds are decreased. There is no tenderness. There is no rebound.  Hypoactive bowel sounds. Exhibits some tenderness in his epigastrium without guarding rebound or peritoneal irritation.  Musculoskeletal: Normal range of motion.  Neurological: He is alert and oriented to person, place, and time.  Skin: Skin is warm and dry. No rash noted.  Psychiatric: He has a normal mood and affect. His behavior is normal.    ED Course  Procedures (including critical care time) Labs Review Labs Reviewed  COMPREHENSIVE METABOLIC PANEL - Abnormal; Notable for the following:    Potassium 3.1 (*)    Chloride 97 (*)    Glucose, Bld 109 (*)    Creatinine, Ser 1.35 (*)    Total Protein 8.5 (*)    AST 43 (*)    All other components within normal limits  CBC -  Abnormal; Notable for the following:    Hemoglobin 17.9 (*)    All other components within normal limits  LIPASE, BLOOD  MAGNESIUM    Imaging Review Dg Abd Acute W/chest  11/08/2015  CLINICAL DATA:  Vomiting and upper abdominal pain for the past 12 days EXAM: DG ABDOMEN ACUTE W/ 1V CHEST COMPARISON:  Chest radiograph - 10/30/2015; CT the abdomen and pelvis - 10/30/2015 FINDINGS: Grossly unchanged cardiac silhouette and mediastinal contours. No focal airspace opacities. No pleural effusion or pneumothorax. Paucity of bowel gas without evidence of enteric obstruction. No pneumoperitoneum, pneumatosis or portal venous gas. No definitive abnormal intra-abdominal calcifications. No acute osseus abnormalities. IMPRESSION: 1.  No acute cardiopulmonary disease. 2. Paucity of bowel gas without evidence of enteric obstruction Electronically Signed   By: Sandi Mariscal M.D.   On: 11/08/2015 20:11   I have personally reviewed and evaluated these images and lab results as part of my medical decision-making.   EKG Interpretation None      MDM   Final diagnoses:  Non-intractable vomiting with nausea, vomiting of unspecified type  Gastritis    Plan plain films for ileus versus obstruction. No risk of obstruction. Potassium replacement. Fluids. Antiemetics, proton pump inhibitors. Reevaluation.   Rechecked the patient was asking to eat his mother's [employer] that she brought in with him. He is finishing some IV magnesium and potassium. After this I will plan discharge home. Scopolamine, appears Zofran, proton pump inhibitor, small frequent meals.   Tanna Furry, MD 11/08/15 2221

## 2015-11-18 ENCOUNTER — Ambulatory Visit (INDEPENDENT_AMBULATORY_CARE_PROVIDER_SITE_OTHER): Payer: Self-pay | Admitting: Gastroenterology

## 2015-11-18 ENCOUNTER — Encounter: Payer: Self-pay | Admitting: Gastroenterology

## 2015-11-18 VITALS — BP 125/87 | HR 84 | Temp 97.1°F | Ht 72.0 in | Wt 200.6 lb

## 2015-11-18 DIAGNOSIS — K219 Gastro-esophageal reflux disease without esophagitis: Secondary | ICD-10-CM

## 2015-11-18 NOTE — Patient Instructions (Addendum)
I have given you samples of Nexium to take once each day, 30 minutes before the first meal of the day. This is over the counter as well.   Please complete the financial assistance forms so this can be on file. I think it would be a good idea to get an ultrasound of your liver at some point.  Follow the reflux diet and avoid alcohol as we discussed.   We will see you in 3 months!   Food Choices for Gastroesophageal Reflux Disease, Adult When you have gastroesophageal reflux disease (GERD), the foods you eat and your eating habits are very important. Choosing the right foods can help ease the discomfort of GERD. WHAT GENERAL GUIDELINES DO I NEED TO FOLLOW?  Choose fruits, vegetables, whole grains, low-fat dairy products, and low-fat meat, fish, and poultry.  Limit fats such as oils, salad dressings, butter, nuts, and avocado.  Keep a food diary to identify foods that cause symptoms.  Avoid foods that cause reflux. These may be different for different people.  Eat frequent small meals instead of three large meals each day.  Eat your meals slowly, in a relaxed setting.  Limit fried foods.  Cook foods using methods other than frying.  Avoid drinking alcohol.  Avoid drinking large amounts of liquids with your meals.  Avoid bending over or lying down until 2-3 hours after eating. WHAT FOODS ARE NOT RECOMMENDED? The following are some foods and drinks that may worsen your symptoms: Vegetables Tomatoes. Tomato juice. Tomato and spaghetti sauce. Chili peppers. Onion and garlic. Horseradish. Fruits Oranges, grapefruit, and lemon (fruit and juice). Meats High-fat meats, fish, and poultry. This includes hot dogs, ribs, ham, sausage, salami, and bacon. Dairy Whole milk and chocolate milk. Sour cream. Cream. Butter. Ice cream. Cream cheese.  Beverages Coffee and tea, with or without caffeine. Carbonated beverages or energy drinks. Condiments Hot sauce. Barbecue sauce.   Sweets/Desserts Chocolate and cocoa. Donuts. Peppermint and spearmint. Fats and Oils High-fat foods, including [place] fries and potato chips. Other Vinegar. Strong spices, such as black pepper, white pepper, red pepper, cayenne, curry powder, cloves, ginger, and chili powder. The items listed above may not be a complete list of foods and beverages to avoid. Contact your dietitian for more information.   This information is not intended to replace advice given to you by your health care provider. Make sure you discuss any questions you have with your health care provider.   Document Released: 06/26/2005 Document Revised: 07/17/2014 Document Reviewed: 04/30/2013 Elsevier Interactive Patient Education [employer].

## 2015-11-18 NOTE — Progress Notes (Signed)
Primary Care Physician:  No PCP Per Patient Primary Gastroenterologist:  Dr. Gala Romney   Chief Complaint  Patient presents with  . Follow-up    ER  . Abdominal Pain    denies now    HPI:   Ian Roy is a 45 y.o. male presenting today at the request of Soyla Dryer, [place] due to abdominal pain, nausea and vomiting. Mother is present with him today. Went to ED on April 22 and May 1st. States since the second visit May 1, he feels completely better. States was throwing up for 11-12 days straight, non-stop, "night and day". Abdominal pain was located diffusely. Towards the end of the episode, his pain was moreso located in LUQ. States diarrhea started a month ago, "still has it" but much improved. Doesn't have it every day any longer. States back to work now. Generally has a BM each morning. No rectal bleeding. Cdiff negative November 02, 2015. Intermittent reflux. States nocturnal reflux used to be worse. Would throw up and feel better. Takes Tums chronically. Prescribed Prilosec but did not take.   Past Medical History  Diagnosis Date  . Bronchitis   . Alcohol use (HCC)     daily  . GERD (gastroesophageal reflux disease)     Past Surgical History  Procedure Laterality Date  . None      No current outpatient prescriptions on file.   No current facility-administered medications for this visit.    Allergies as of 11/18/2015  . (No Known Allergies)    Family History  Problem Relation Age of Onset  . Cancer Mother     breast  . Diabetes Mother   . Cancer Maternal Aunt     Social History   Social History  . Marital Status: Single    Spouse Name: N/A  . Number of Children: N/A  . Years of Education: N/A   Occupational History  . CNA     prn  . handyman    Social History Main Topics  . Smoking status: Current Every Day Smoker -- 1.00 packs/day for 14 years    Types: Cigarettes  . Smokeless tobacco: Never Used  . Alcohol Use: 50.4 oz/week    84 Cans of beer,  0 Standard drinks or equivalent per week     Comment: 12 beer / day  . Drug Use: No  . Sexual Activity: Not on file   Other Topics Concern  . Not on file   Social History Narrative    Review of Systems: As noted in HPI.   Physical Exam: BP 125/87 mmHg  Pulse 84  Temp(Src) 97.1 F (36.2 C)  Ht 6' (1.829 m)  Wt 200 lb 9.6 oz (90.992 kg)  BMI 27.20 kg/m2 General:   Alert and oriented. Pleasant and cooperative. Well-nourished and well-developed.  Head:  Normocephalic and atraumatic. Eyes:  Without icterus, sclera clear and conjunctiva pink.  Ears:  Normal auditory acuity. Nose:  No deformity, discharge,  or lesions. Lungs:  Clear to auscultation bilaterally. No wheezes, rales, or rhonchi. No distress.  Heart:  S1, S2 present without murmurs appreciated.  Abdomen:  +BS, soft, non-tender and non-distended. No HSM noted. No guarding or rebound. No masses appreciated.  Rectal:  Deferred  Msk:  Symmetrical without gross deformities. Normal posture. Extremities:  Without edema. Neurologic:  Alert and  oriented x4;  grossly normal neurologically. Psych:  Alert and cooperative. Normal mood and affect.  CT abd/pelvis with contrast April 2017:  No acute findings  within abdomen or pelvis. Small hiatal hernia.   Lab Results  Component Value Date   WBC 10.1 11/08/2015   HGB 17.9* 11/08/2015   HCT 50.6 11/08/2015   MCV 90.8 11/08/2015   PLT 350 11/08/2015   Lab Results  Component Value Date   ALT 61 11/08/2015   AST 43* 11/08/2015   ALKPHOS 49 11/08/2015   BILITOT 0.9 11/08/2015   Lab Results  Component Value Date   CREATININE 1.35* 11/08/2015   BUN 17 11/08/2015   NA 139 11/08/2015   K 3.1* 11/08/2015   CL 97* 11/08/2015   CO2 28 11/08/2015

## 2015-11-26 NOTE — Assessment & Plan Note (Signed)
Recent episode of N/V/D that appears self-limiting and has improved significantly. Notable history of ETOH abuse, likely uncontrolled GERD/gastritis playing a role. Trial of PPI therapy daily recommended. Nexium samples provided. Doing well at time of visit. With chronic ETOH use, discussed at length risks regarding continued ETOH use. May need to consider US liver at next visit. Very mild elevation of AST as expected in May. Will repeat labs in 3 months, follow GERD handout, PPI (Nexium) daily. Return in 3 months.

## 2015-11-29 NOTE — Progress Notes (Signed)
NO PCP PER PATIENT °

## 2015-12-07 ENCOUNTER — Encounter: Payer: Self-pay | Admitting: Physician Assistant

## 2015-12-07 ENCOUNTER — Ambulatory Visit: Payer: Self-pay | Admitting: Physician Assistant

## 2015-12-07 VITALS — BP 110/70 | HR 95 | Temp 98.1°F | Ht 72.0 in | Wt 196.6 lb

## 2015-12-07 DIAGNOSIS — F17210 Nicotine dependence, cigarettes, uncomplicated: Secondary | ICD-10-CM

## 2015-12-07 DIAGNOSIS — E876 Hypokalemia: Secondary | ICD-10-CM

## 2015-12-07 LAB — BASIC METABOLIC PANEL
BUN: 7 mg/dL (ref 7–25)
CALCIUM: 9 mg/dL (ref 8.6–10.3)
CO2: 27 mmol/L (ref 20–31)
CREATININE: 0.98 mg/dL (ref 0.60–1.35)
Chloride: 104 mmol/L (ref 98–110)
GLUCOSE: 79 mg/dL (ref 65–99)
Potassium: 4.6 mmol/L (ref 3.5–5.3)
Sodium: 139 mmol/L (ref 135–146)

## 2015-12-07 NOTE — Patient Instructions (Signed)
Smoking Cessation, Tips for Success If you are ready to quit smoking, congratulations! You have chosen to help yourself be healthier. Cigarettes bring nicotine, tar, carbon monoxide, and other irritants into your body. Your lungs, heart, and blood vessels will be able to work better without these poisons. There are many different ways to quit smoking. Nicotine gum, nicotine patches, a nicotine inhaler, or nicotine nasal spray can help with physical craving. Hypnosis, support groups, and medicines help break the habit of smoking. WHAT THINGS CAN I DO TO MAKE QUITTING EASIER?  Here are some tips to help you quit for good:  Pick a date when you will quit smoking completely. Tell all of your friends and family about your plan to quit on that date.  Do not try to slowly cut down on the number of cigarettes you are smoking. Pick a quit date and quit smoking completely starting on that day.  Throw away all cigarettes.   Clean and remove all ashtrays from your home, work, and car.  On a card, write down your reasons for quitting. Carry the card with you and read it when you get the urge to smoke.  Cleanse your body of nicotine. Drink enough water and fluids to keep your urine clear or pale yellow. Do this after quitting to flush the nicotine from your body.  Learn to predict your moods. Do not let a bad situation be your excuse to have a cigarette. Some situations in your life might tempt you into wanting a cigarette.  Never have "just one" cigarette. It leads to wanting another and another. Remind yourself of your decision to quit.  Change habits associated with smoking. If you smoked while driving or when feeling stressed, try other activities to replace smoking. Stand up when drinking your coffee. Brush your teeth after eating. Sit in a different chair when you read the paper. Avoid alcohol while trying to quit, and try to drink fewer caffeinated beverages. Alcohol and caffeine may urge you to  smoke.  Avoid foods and drinks that can trigger a desire to smoke, such as sugary or spicy foods and alcohol.  Ask people who smoke not to smoke around you.  Have something planned to do right after eating or having a cup of coffee. For example, plan to take a walk or exercise.  Try a relaxation exercise to calm you down and decrease your stress. Remember, you may be tense and nervous for the first 2 weeks after you quit, but this will pass.  Find new activities to keep your hands busy. Play with a pen, coin, or rubber band. Doodle or draw things on paper.  Brush your teeth right after eating. This will help cut down on the craving for the taste of tobacco after meals. You can also try mouthwash.   Use oral substitutes in place of cigarettes. Try using lemon drops, carrots, cinnamon sticks, or chewing gum. Keep them handy so they are available when you have the urge to smoke.  When you have the urge to smoke, try deep breathing.  Designate your home as a nonsmoking area.  If you are a heavy smoker, ask your health care provider about a prescription for nicotine chewing gum. It can ease your withdrawal from nicotine.  Reward yourself. Set aside the cigarette money you save and buy yourself something nice.  Look for support from others. Join a support group or smoking cessation program. Ask someone at home or at work to help you with your plan   to quit smoking.  Always ask yourself, "Do I need this cigarette or is this just a reflex?" Tell yourself, "Today, I choose not to smoke," or "I do not want to smoke." You are reminding yourself of your decision to quit.  Do not replace cigarette smoking with electronic cigarettes (commonly called e-cigarettes). The safety of e-cigarettes is unknown, and some may contain harmful chemicals.  If you relapse, do not give up! Plan ahead and think about what you will do the next time you get the urge to smoke. HOW WILL I FEEL WHEN I QUIT SMOKING? You  may have symptoms of withdrawal because your body is used to nicotine (the addictive substance in cigarettes). You may crave cigarettes, be irritable, feel very hungry, cough often, get headaches, or have difficulty concentrating. The withdrawal symptoms are only temporary. They are strongest when you first quit but will go away within 10-14 days. When withdrawal symptoms occur, stay in control. Think about your reasons for quitting. Remind yourself that these are signs that your body is healing and getting used to being without cigarettes. Remember that withdrawal symptoms are easier to treat than the major diseases that smoking can cause.  Even after the withdrawal is over, expect periodic urges to smoke. However, these cravings are generally short lived and will go away whether you smoke or not. Do not smoke! WHAT RESOURCES ARE AVAILABLE TO HELP ME QUIT SMOKING? Your health care provider can direct you to community resources or hospitals for support, which may include:  Group support.  Education.  Hypnosis.  Therapy.   This information is not intended to replace advice given to you by your health care provider. Make sure you discuss any questions you have with your health care provider.   Document Released: 03/24/2004 Document Revised: 07/17/2014 Document Reviewed: 12/12/2012 Elsevier Interactive Patient Education 2016 [employer]  

## 2015-12-07 NOTE — Progress Notes (Signed)
   BP 110/70 mmHg  Pulse 95  Temp(Src) 98.1 F (36.7 C)  Ht 6' (1.829 m)  Wt 196 lb 9.6 oz (89.177 kg)  BMI 26.66 kg/m2  SpO2 96%   Subjective:    Patient ID: Ian Roy, male    DOB: 1971-02-10, 45 y.o.   MRN: [id number]  HPI: Ian Roy is a 45 y.o. male presenting on 12/07/2015 for Nausea and Foreign Body in Skin   HPI   Chief Complaint  Patient presents with  . Nausea    pt states he is feeling better than last time he was here.  . Foreign Body in Skin    R hand 4th digit     Relevant past medical, surgical, family and social history reviewed and updated as indicated. Interim medical history since our last visit reviewed. Allergies and medications reviewed and updated.  No current outpatient prescriptions on file.   Review of Systems  Constitutional: Negative for fever, chills, diaphoresis, appetite change, fatigue and unexpected weight change.  HENT: Negative for congestion, dental problem, drooling, ear pain, facial swelling, hearing loss, mouth sores, sneezing, sore throat, trouble swallowing and voice change.   Eyes: Negative for pain, discharge, redness, itching and visual disturbance.  Respiratory: Negative for cough, choking, shortness of breath and wheezing.   Cardiovascular: Negative for chest pain, palpitations and leg swelling.  Gastrointestinal: Negative for vomiting, abdominal pain, diarrhea, constipation and blood in stool.  Endocrine: Negative for cold intolerance, heat intolerance and polydipsia.  Genitourinary: Negative for dysuria, hematuria and decreased urine volume.  Musculoskeletal: Negative for back pain, arthralgias and gait problem.  Skin: Negative for rash.  Allergic/Immunologic: Negative for environmental allergies.  Neurological: Negative for seizures, syncope, light-headedness and headaches.  Hematological: Negative for adenopathy.  Psychiatric/Behavioral: Negative for suicidal ideas, dysphoric mood and agitation. The  patient is not nervous/anxious.     Per HPI unless specifically indicated above     Objective:    BP 110/70 mmHg  Pulse 95  Temp(Src) 98.1 F (36.7 C)  Ht 6' (1.829 m)  Wt 196 lb 9.6 oz (89.177 kg)  BMI 26.66 kg/m2  SpO2 96%  Wt Readings from Last 3 Encounters:  12/07/15 196 lb 9.6 oz (89.177 kg)  11/18/15 200 lb 9.6 oz (90.992 kg)  11/08/15 196 lb (88.905 kg)    Physical Exam  Constitutional: He is oriented to person, place, and time. He appears well-developed and well-nourished.  HENT:  Head: Normocephalic and atraumatic.  Neck: Neck supple.  Cardiovascular: Normal rate and regular rhythm.   Pulmonary/Chest: Effort normal and breath sounds normal. He has no wheezes.  Abdominal: Soft. Bowel sounds are normal. There is no hepatosplenomegaly. There is no tenderness.  Musculoskeletal: He exhibits no edema.  Lymphadenopathy:    He has no cervical adenopathy.  Neurological: He is alert and oriented to person, place, and time.  Skin: Skin is warm and dry.  Psychiatric: He has a normal mood and affect. His behavior is normal.  Vitals reviewed.  R 4th fiingertip cleaned. Instilled 1% lidocaine and explored. No FB found.  Applied bacitracin and bandaid     Assessment & Plan:   Encounter Diagnoses  Name Primary?  . Cigarette nicotine dependence without complication Yes  . Hypokalemia     -Recheck bmp today -Counseled smoking cessation -Counseled on abstaining from etoh -F/u 1 year. RTO sooner prn

## 2016-02-21 ENCOUNTER — Encounter: Payer: Self-pay | Admitting: Internal Medicine

## 2016-02-21 ENCOUNTER — Ambulatory Visit: Payer: Self-pay | Admitting: Gastroenterology

## 2016-03-15 ENCOUNTER — Encounter: Payer: Self-pay | Admitting: Gastroenterology

## 2016-03-15 ENCOUNTER — Ambulatory Visit (INDEPENDENT_AMBULATORY_CARE_PROVIDER_SITE_OTHER): Payer: Self-pay | Admitting: Gastroenterology

## 2016-03-15 DIAGNOSIS — R7989 Other specified abnormal findings of blood chemistry: Secondary | ICD-10-CM | POA: Insufficient documentation

## 2016-03-15 DIAGNOSIS — R945 Abnormal results of liver function studies: Principal | ICD-10-CM

## 2016-03-15 NOTE — Patient Instructions (Signed)
Please have blood work drawn today. I encourage you to decrease alcohol use and only use sparingly.  I will see when your first colonoscopy is due and send you a note in "MyChart".  We will see you back as needed!

## 2016-03-15 NOTE — Progress Notes (Signed)
Referring Provider: Soyla Dryer, PA-C Primary Care Physician:  Soyla Dryer, PA-C  Primary GI: Dr. Gala Romney   Chief Complaint  Patient presents with  . Follow-up    c/o some reflux    HPI:   Ian Roy is a 45 y.o. male presenting today with a history of abdominal pain, N/V/D, seen May 2017 and likely was dealing with self-limiting episode. History of GERD in setting of chronic ETOH use. Very mild elevation of AST in May. Needs repeat labs now.   Used to be getting reflux symptoms about 2-3 times a week. Used to have it in the evening time but now only has it every few weeks. Now just takes tums only as needed. Only takes it every few weeks. No PPI. Drinks light beer in the evenings before bed. No abdominal pain. If drinks a lot of beer one evening, will have a looser stool the next day. Has been trying to cut back a little bit.   Father with history of colon polyps age 32.   Past Medical History:  Diagnosis Date  . Alcohol use (HCC)    daily  . Bronchitis   . GERD (gastroesophageal reflux disease)     Past Surgical History:  Procedure Laterality Date  . None      Current Outpatient Prescriptions  Medication Sig Dispense Refill  . calcium carbonate (TUMS - DOSED IN MG ELEMENTAL CALCIUM) 500 MG chewable tablet Chew 2 tablets by mouth as needed for indigestion or heartburn.     No current facility-administered medications for this visit.     Allergies as of 03/15/2016  . (No Known Allergies)    Family History  Problem Relation Age of Onset  . Cancer Mother     breast  . Diabetes Mother   . Colon polyps Father 83  . Cancer Maternal Aunt   . Colon cancer Neg Hx     Social History   Social History  . Marital status: Divorced    Spouse name: N/A  . Number of children: N/A  . Years of education: N/A   Occupational History  . CNA     prn  . handyman    Social History Main Topics  . Smoking status: Current Every Day Smoker    Packs/day: 1.00      Years: 14.00    Types: Cigarettes  . Smokeless tobacco: Never Used  . Alcohol use 50.4 oz/week    84 Cans of beer per week     Comment: 12 beer / day  . Drug use: No  . Sexual activity: Not Asked   Other Topics Concern  . None   Social History Narrative  . None    Review of Systems: As mentioned in HPI   Physical Exam: BP 127/84   Pulse 76   Temp 98 F (36.7 C) (Oral)   Ht 6' (1.829 m)   Wt 191 lb 12.8 oz (87 kg)   BMI 26.01 kg/m  General:   Alert and oriented. No distress noted. Pleasant and cooperative.  Head:  Normocephalic and atraumatic. Eyes:  Conjuctiva clear without scleral icterus. Heart:  S1, S2 present without murmurs, rubs, or gallops. Regular rate and rhythm. Abdomen:  +BS, soft, non-tender and non-distended. No rebound or guarding. No HSM or masses noted. Msk:  Symmetrical without gross deformities. Normal posture. Extremities:  Without edema. Neurologic:  Alert and  oriented x4;  grossly normal neurologically. Psych:  Alert and cooperative. Normal mood and affect.  Lab Results  Component Value Date   ALT 61 11/08/2015   AST 43 (H) 11/08/2015   ALKPHOS 49 11/08/2015   BILITOT 0.9 11/08/2015

## 2016-03-19 ENCOUNTER — Encounter: Payer: Self-pay | Admitting: Gastroenterology

## 2016-03-19 ENCOUNTER — Telehealth: Payer: Self-pay | Admitting: Gastroenterology

## 2016-03-19 NOTE — Assessment & Plan Note (Signed)
Very marginally elevated AST in the setting of daily ETOH use. Will recheck now. No other concerning signs/symptoms. As of note, he tells me that his father had a history of colon polyps. He has never had a colonoscopy. Will need an initial screening colonoscopy in the near future. Will let patient know and see if he can be triaged for this if he is willing to proceed.

## 2016-03-19 NOTE — Telephone Encounter (Signed)
Patient's father had history of colon polyps. Should begin initial screening colonoscopy now. I briefly discussed this at his visit. Is he willing to proceed with this? If so, we can triage him over the phone.

## 2016-03-20 NOTE — Progress Notes (Signed)
CC'ED TO PCP 

## 2016-04-04 NOTE — Telephone Encounter (Signed)
Late entry- I spoke with the pt yesterday. He is fine with setting up tcs, but he does not have any insurance. [facility] and they stated they could give him the paperwork for patient assistance at [facility]. I tried to call the pt but was unable to get the call to go thru.

## 2016-04-06 NOTE — Telephone Encounter (Signed)
Tried to call pt- call would not go through. 

## 2016-04-19 NOTE — Telephone Encounter (Signed)
Tried to call, call would not go through.  

## 2016-04-19 NOTE — Telephone Encounter (Signed)
Mailed a letter to the pt.

## 2016-12-06 ENCOUNTER — Ambulatory Visit: Payer: Self-pay | Admitting: Physician Assistant

## 2016-12-27 ENCOUNTER — Encounter: Payer: Self-pay | Admitting: Physician Assistant

## 2017-12-05 ENCOUNTER — Ambulatory Visit: Payer: Self-pay | Admitting: Physician Assistant

## 2017-12-05 ENCOUNTER — Other Ambulatory Visit (HOSPITAL_COMMUNITY)
Admission: RE | Admit: 2017-12-05 | Discharge: 2017-12-05 | Disposition: A | Discharge status: Home / Self Care | Payer: Self-pay | Source: Ambulatory Visit | Attending: Physician Assistant | Admitting: Physician Assistant

## 2017-12-05 ENCOUNTER — Ambulatory Visit (HOSPITAL_COMMUNITY)
Admission: RE | Admit: 2017-12-05 | Discharge: 2017-12-05 | Disposition: A | Discharge status: Home / Self Care | Payer: Self-pay | Source: Ambulatory Visit | Attending: Physician Assistant | Admitting: Physician Assistant

## 2017-12-05 ENCOUNTER — Other Ambulatory Visit: Payer: Self-pay | Admitting: Physician Assistant

## 2017-12-05 ENCOUNTER — Encounter: Payer: Self-pay | Admitting: Physician Assistant

## 2017-12-05 VITALS — BP 111/77 | HR 85 | Temp 97.5°F | Ht 72.0 in | Wt 207.5 lb

## 2017-12-05 DIAGNOSIS — E785 Hyperlipidemia, unspecified: Secondary | ICD-10-CM | POA: Insufficient documentation

## 2017-12-05 DIAGNOSIS — Z7689 Persons encountering health services in other specified circumstances: Secondary | ICD-10-CM

## 2017-12-05 DIAGNOSIS — R059 Cough, unspecified: Secondary | ICD-10-CM

## 2017-12-05 DIAGNOSIS — R062 Wheezing: Secondary | ICD-10-CM | POA: Insufficient documentation

## 2017-12-05 DIAGNOSIS — F17219 Nicotine dependence, cigarettes, with unspecified nicotine-induced disorders: Secondary | ICD-10-CM | POA: Insufficient documentation

## 2017-12-05 DIAGNOSIS — R05 Cough: Secondary | ICD-10-CM | POA: Insufficient documentation

## 2017-12-05 DIAGNOSIS — R1310 Dysphagia, unspecified: Secondary | ICD-10-CM

## 2017-12-05 DIAGNOSIS — Z8371 Family history of colonic polyps: Secondary | ICD-10-CM

## 2017-12-05 LAB — CBC
HCT: 51.5 % (ref 39.0–52.0)
HEMOGLOBIN: 17.8 g/dL — AB (ref 13.0–17.0)
MCH: 32.3 pg (ref 26.0–34.0)
MCHC: 34.6 g/dL (ref 30.0–36.0)
MCV: 93.5 fL (ref 78.0–100.0)
PLATELETS: 265 10*3/uL (ref 150–400)
RBC: 5.51 MIL/uL (ref 4.22–5.81)
RDW: 12.9 % (ref 11.5–15.5)
WBC: 8.7 10*3/uL (ref 4.0–10.5)

## 2017-12-05 LAB — COMPREHENSIVE METABOLIC PANEL
ALBUMIN: 3.9 g/dL (ref 3.5–5.0)
ALK PHOS: 63 U/L (ref 38–126)
ALT: 42 U/L (ref 17–63)
ANION GAP: 8 (ref 5–15)
AST: 33 U/L (ref 15–41)
BILIRUBIN TOTAL: 0.9 mg/dL (ref 0.3–1.2)
BUN: 10 mg/dL (ref 6–20)
CALCIUM: 9.1 mg/dL (ref 8.9–10.3)
CO2: 27 mmol/L (ref 22–32)
CREATININE: 1.06 mg/dL (ref 0.61–1.24)
Chloride: 101 mmol/L (ref 101–111)
GFR calc Af Amer: >60 mL/min (ref >60)
GFR calc non Af Amer: >60 mL/min (ref >60)
Glucose, Bld: 94 mg/dL (ref 65–99)
Potassium: 4.2 mmol/L (ref 3.5–5.1)
SODIUM: 136 mmol/L (ref 135–145)
TOTAL PROTEIN: 7.7 g/dL (ref 6.5–8.1)

## 2017-12-05 LAB — LIPID PANEL
CHOL/HDL RATIO: 3.1 ratio
CHOLESTEROL: 155 mg/dL (ref 0–200)
HDL: 50 mg/dL (ref >40)
LDL Cholesterol: 34 mg/dL (ref 0–99)
Triglycerides: 356 mg/dL — ABNORMAL HIGH (ref <150)
VLDL: 71 mg/dL — ABNORMAL HIGH (ref 0–40)

## 2017-12-05 MED ORDER — BENZONATATE 100 MG PO CAPS: ORAL_CAPSULE | ORAL | 3 refills | Status: DC

## 2017-12-05 MED ORDER — AZITHROMYCIN 250 MG PO TABS: ORAL_TABLET | ORAL | 0 refills | Status: AC

## 2017-12-05 NOTE — Progress Notes (Signed)
BP 111/77 (BP Location: Right Arm, Patient Position: Sitting, Cuff Size: Normal)   Pulse 85   Temp (!) 97.5 F (36.4 C) (Other (Comment))   Ht 6' (1.829 m)   Wt 207 lb 8 oz (94.1 kg)   SpO2 94%   BMI 28.14 kg/m    Subjective:    Patient ID: Ian Roy, male    DOB: October 14, 1970, 47 y.o.   MRN: [id number]  HPI: FREDDRICK Roy is a 47 y.o. male presenting on 12/05/2017 for New Patient (Initial Visit) (new/old patient. c/o persistent cough for more than a year)   HPI   Chief Complaint  Patient presents with  . New Patient (Initial Visit)    new/old patient. c/o persistent cough for more than a year     He is still having trouble swallowing sometimes  Pt just got out of jail in february for DUI  Pt says the cough started in prison and he thinks the buildling was moldy.   He had mold in his home when he got out of prison as well.    He continues to smoke.   He still drinks alcohol but a lot less than he used to.    Relevant past medical, surgical, family and social history reviewed and updated as indicated. Interim medical history since our last visit reviewed. Allergies and medications reviewed and updated.   Current Outpatient Medications:  .  calcium carbonate (TUMS - DOSED IN MG ELEMENTAL CALCIUM) 500 MG chewable tablet, Chew 2 tablets by mouth as needed for indigestion or heartburn., Disp: , Rfl:  .  diphenhydrAMINE (BENADRYL) 25 MG tablet, Take 25 mg by mouth at bedtime as needed., Disp: , Rfl:  .  guaiFENesin (MUCINEX) 600 MG 12 hr tablet, Take 600 mg by mouth at bedtime as needed., Disp: , Rfl:  .  guaiFENesin-dextromethorphan (ROBITUSSIN DM) 100-10 MG/5ML syrup, Take 5 mLs by mouth daily as needed for cough., Disp: , Rfl:  .  loratadine (CLARITIN) 10 MG tablet, Take 10 mg by mouth daily as needed for allergies., Disp: , Rfl:    Review of Systems  Constitutional: Positive for appetite change and fatigue. Negative for chills, diaphoresis, fever and  unexpected weight change.  HENT: Positive for sneezing, trouble swallowing and voice change. Negative for congestion, drooling, ear pain, facial swelling, hearing loss, mouth sores and sore throat.   Eyes: Positive for visual disturbance. Negative for pain, discharge, redness and itching.  Respiratory: Positive for cough, choking, shortness of breath and wheezing.   Cardiovascular: Negative for chest pain, palpitations and leg swelling.  Gastrointestinal: Negative for abdominal pain, blood in stool, constipation, diarrhea and vomiting.  Endocrine: Negative for cold intolerance, heat intolerance and polydipsia.  Genitourinary: Negative for decreased urine volume, dysuria and hematuria.  Musculoskeletal: Negative for arthralgias, back pain and gait problem.  Skin: Negative for rash.  Allergic/Immunologic: Positive for environmental allergies.  Neurological: Negative for seizures, syncope, light-headedness and headaches.  Hematological: Negative for adenopathy.  Psychiatric/Behavioral: Negative for agitation, dysphoric mood and suicidal ideas. The patient is nervous/anxious.     Per HPI unless specifically indicated above     Objective:    BP 111/77 (BP Location: Right Arm, Patient Position: Sitting, Cuff Size: Normal)   Pulse 85   Temp (!) 97.5 F (36.4 C) (Other (Comment))   Ht 6' (1.829 m)   Wt 207 lb 8 oz (94.1 kg)   SpO2 94%   BMI 28.14 kg/m   Wt Readings from Last 3 Encounters:  12/05/17 207 lb 8 oz (94.1 kg)  03/15/16 191 lb 12.8 oz (87 kg)  12/07/15 196 lb 9.6 oz (89.2 kg)    Physical Exam  Constitutional: He is oriented to person, place, and time. He appears well-developed and well-nourished.  HENT:  Head: Normocephalic and atraumatic.  Neck: Neck supple.  Cardiovascular: Normal rate and regular rhythm.  Pulmonary/Chest: Effort normal. No respiratory distress. He has wheezes (few soft exp wheezes R base). He has no rales.  Abdominal: Soft. Bowel sounds are normal.  There is no hepatosplenomegaly. There is no tenderness.  Musculoskeletal: He exhibits no edema.  Lymphadenopathy:    He has no cervical adenopathy.  Neurological: He is alert and oriented to person, place, and time.  Skin: Skin is warm and dry.  Psychiatric: He has a normal mood and affect. His behavior is normal.  Vitals reviewed.        Assessment & Plan:    Encounter Diagnoses  Name Primary?  . Encounter to establish care Yes  . Cough   . Cigarette nicotine dependence with nicotine-induced disorder   . Wheezing   . Hyperlipidemia, unspecified hyperlipidemia type   . Family history of colonic polyps   . Dysphagia, unspecified type      -Check lipids, cmp, cbc -get cxr -Counseled smoking cessation -gave [employer] application -Refer back to GI for colonoscopy- Roseanne Kaufman, NP note states pt due for colonoscopy in light of pt's family history.  Also for trouble swallowing -pt to follow up 1 month.  RTO sooner prn

## 2017-12-06 ENCOUNTER — Encounter: Payer: Self-pay | Admitting: Internal Medicine

## 2018-01-07 ENCOUNTER — Telehealth: Payer: Self-pay | Admitting: Student

## 2018-01-07 NOTE — Telephone Encounter (Signed)
Pt called stating he has been vomiting since Thursday night 01-03-18 and is requesting appointment with GI. Pt states he had vomited for 12 days in the past (2 years ago) and was unable to know why. Pt has been scheduled to come into [facility] tomorrow 01-08-18 due to need to be reexamined for this to make referral. Pt verbalized understanding and is aware of appointment.

## 2018-01-08 ENCOUNTER — Emergency Department (HOSPITAL_COMMUNITY): Payer: Self-pay

## 2018-01-08 ENCOUNTER — Ambulatory Visit: Payer: Self-pay | Admitting: Physician Assistant

## 2018-01-08 ENCOUNTER — Encounter (HOSPITAL_COMMUNITY): Payer: Self-pay | Admitting: Emergency Medicine

## 2018-01-08 ENCOUNTER — Emergency Department (HOSPITAL_COMMUNITY)
Admission: EM | Admit: 2018-01-08 | Discharge: 2018-01-09 | Disposition: A | Discharge status: Home / Self Care | Payer: Self-pay | Attending: Emergency Medicine | Admitting: Emergency Medicine

## 2018-01-08 ENCOUNTER — Encounter: Payer: Self-pay | Admitting: Physician Assistant

## 2018-01-08 ENCOUNTER — Other Ambulatory Visit: Payer: Self-pay

## 2018-01-08 VITALS — BP 108/68 | HR 82 | Temp 97.7°F | Ht 72.0 in | Wt 206.0 lb

## 2018-01-08 DIAGNOSIS — R112 Nausea with vomiting, unspecified: Secondary | ICD-10-CM

## 2018-01-08 DIAGNOSIS — K529 Noninfective gastroenteritis and colitis, unspecified: Secondary | ICD-10-CM | POA: Insufficient documentation

## 2018-01-08 DIAGNOSIS — F17210 Nicotine dependence, cigarettes, uncomplicated: Secondary | ICD-10-CM | POA: Insufficient documentation

## 2018-01-08 DIAGNOSIS — R1110 Vomiting, unspecified: Secondary | ICD-10-CM

## 2018-01-08 DIAGNOSIS — F17219 Nicotine dependence, cigarettes, with unspecified nicotine-induced disorders: Secondary | ICD-10-CM

## 2018-01-08 DIAGNOSIS — F1010 Alcohol abuse, uncomplicated: Secondary | ICD-10-CM

## 2018-01-08 DIAGNOSIS — E785 Hyperlipidemia, unspecified: Secondary | ICD-10-CM

## 2018-01-08 DIAGNOSIS — Z79899 Other long term (current) drug therapy: Secondary | ICD-10-CM | POA: Insufficient documentation

## 2018-01-08 LAB — CBC
HEMATOCRIT: 48.8 % (ref 39.0–52.0)
HEMOGLOBIN: 17 g/dL (ref 13.0–17.0)
MCH: 33.2 pg (ref 26.0–34.0)
MCHC: 34.8 g/dL (ref 30.0–36.0)
MCV: 95.3 fL (ref 78.0–100.0)
Platelets: 280 10*3/uL (ref 150–400)
RBC: 5.12 MIL/uL (ref 4.22–5.81)
RDW: 13 % (ref 11.5–15.5)
WBC: 10.2 10*3/uL (ref 4.0–10.5)

## 2018-01-08 LAB — COMPREHENSIVE METABOLIC PANEL
ALT: 51 U/L — ABNORMAL HIGH (ref 0–44)
ANION GAP: 10 (ref 5–15)
AST: 38 U/L (ref 15–41)
Albumin: 4.2 g/dL (ref 3.5–5.0)
Alkaline Phosphatase: 42 U/L (ref 38–126)
BUN: 9 mg/dL (ref 6–20)
CHLORIDE: 97 mmol/L — AB (ref 98–111)
CO2: 27 mmol/L (ref 22–32)
Calcium: 9.1 mg/dL (ref 8.9–10.3)
Creatinine, Ser: 0.97 mg/dL (ref 0.61–1.24)
GFR calc Af Amer: >60 mL/min (ref >60)
Glucose, Bld: 98 mg/dL (ref 70–99)
POTASSIUM: 3.1 mmol/L — AB (ref 3.5–5.1)
SODIUM: 134 mmol/L — AB (ref 135–145)
TOTAL PROTEIN: 7.5 g/dL (ref 6.5–8.1)
Total Bilirubin: 0.9 mg/dL (ref 0.3–1.2)

## 2018-01-08 LAB — URINALYSIS, ROUTINE W REFLEX MICROSCOPIC
GLUCOSE, UA: NEGATIVE mg/dL
Hgb urine dipstick: NEGATIVE
Ketones, ur: 40 mg/dL — AB
LEUKOCYTES UA: NEGATIVE
NITRITE: NEGATIVE
PH: 6 (ref 5.0–8.0)
SPECIFIC GRAVITY, URINE: 1.025 (ref 1.005–1.030)

## 2018-01-08 LAB — URINALYSIS, MICROSCOPIC (REFLEX)

## 2018-01-08 LAB — LIPASE, BLOOD: LIPASE: 22 U/L (ref 11–51)

## 2018-01-08 LAB — RAPID URINE DRUG SCREEN, HOSP PERFORMED
AMPHETAMINES: NOT DETECTED
BENZODIAZEPINES: NOT DETECTED
COCAINE: NOT DETECTED
Opiates: NOT DETECTED
TETRAHYDROCANNABINOL: NOT DETECTED

## 2018-01-08 MED ORDER — PROMETHAZINE HCL 25 MG PO TABS: 25.0000 mg | ORAL_TABLET | Freq: Three times a day (TID) | ORAL | 0 refills | Status: DC | PRN

## 2018-01-08 MED ORDER — IOPAMIDOL (ISOVUE-300) INJECTION 61%
100.0000 mL | Freq: Once | INTRAVENOUS | Status: AC | PRN
  Administered 2018-01-08: 100 mL via INTRAVENOUS

## 2018-01-08 MED ORDER — ONDANSETRON HCL 4 MG/2ML IJ SOLN
4.0000 mg | Freq: Once | INTRAMUSCULAR | Status: AC
  Administered 2018-01-08: 4 mg via INTRAVENOUS
  Filled 2018-01-08: qty 2

## 2018-01-08 MED ORDER — HYDROMORPHONE HCL 1 MG/ML IJ SOLN
1.0000 mg | Freq: Once | INTRAMUSCULAR | Status: AC
  Administered 2018-01-08: 1 mg via INTRAVENOUS
  Filled 2018-01-08: qty 1

## 2018-01-08 MED ORDER — SODIUM CHLORIDE 0.9 % IV BOLUS
1000.0000 mL | Freq: Once | INTRAVENOUS | Status: AC
  Administered 2018-01-08: 1000 mL via INTRAVENOUS

## 2018-01-08 NOTE — ED Notes (Signed)
Patient transported to X-ray 

## 2018-01-08 NOTE — ED Notes (Signed)
Patient transported to CT 

## 2018-01-08 NOTE — Progress Notes (Signed)
BP 108/68 (BP Location: Left Arm, Patient Position: Sitting, Cuff Size: Normal)   Pulse 82   Temp 97.7 F (36.5 C)   Ht 6' (1.829 m)   Wt 206 lb (93.4 kg)   SpO2 96%   BMI 27.94 kg/m    Subjective:    Patient ID: Ian Roy, male    DOB: 07/18/1970, 47 y.o.   MRN: [id number]  HPI: Ian Roy is a 47 y.o. male presenting on 01/08/2018 for Emesis (began on Thursday. pt states last episode was yesterday around 5:30. pt has not vomitted today. pt has taken pepto bismol)   HPI  Chief Complaint  Patient presents with  . Emesis    began on Thursday. pt states last episode was yesterday around 5:30. pt has not vomitted today. pt has taken pepto bismol   Pt says the worst thing is stomach crampy.  He is using otc pepto and occasionally an otc ranitidine.  Pt had this type of thing several years ago.  Pt states last etoh was Thursday.  He says he thinks he may quit alcohol.   Pt has appt with GI in August.   He gets a wave of nausea sometimes with BM (just had a BM while awaiting appt here today)  Pt is eating and drinking and keeping enough down to avoid dehydration  Pt has not turned in all paperwork for [employer] application  Relevant past medical, surgical, family and social history reviewed and updated as indicated. Interim medical history since our last visit reviewed. Allergies and medications reviewed and updated.   Current Outpatient Medications:  .  bismuth subsalicylate (PEPTO BISMOL) 262 MG chewable tablet, Chew 524 mg by mouth as needed., Disp: , Rfl:  .  calcium carbonate (TUMS - DOSED IN MG ELEMENTAL CALCIUM) 500 MG chewable tablet, Chew 2 tablets by mouth as needed for indigestion or heartburn., Disp: , Rfl:  .  ranitidine (ZANTAC) 75 MG tablet, Take 75 mg by mouth daily as needed for heartburn., Disp: , Rfl:    Review of Systems  Constitutional: Positive for appetite change, chills, diaphoresis and fatigue. Negative for fever and  unexpected weight change.  HENT: Negative for congestion, dental problem, drooling, ear pain, facial swelling, hearing loss, mouth sores, sneezing, sore throat, trouble swallowing and voice change.   Eyes: Negative for pain, discharge, redness, itching and visual disturbance.  Respiratory: Negative for cough, choking, shortness of breath and wheezing.   Cardiovascular: Negative for chest pain, palpitations and leg swelling.  Gastrointestinal: Positive for abdominal pain and vomiting. Negative for blood in stool, constipation and diarrhea.  Endocrine: Negative for cold intolerance, heat intolerance and polydipsia.  Genitourinary: Negative for decreased urine volume, dysuria and hematuria.  Musculoskeletal: Negative for arthralgias, back pain and gait problem.  Skin: Negative for rash.  Allergic/Immunologic: Negative for environmental allergies.  Neurological: Negative for seizures, syncope, light-headedness and headaches.  Hematological: Negative for adenopathy.  Psychiatric/Behavioral: Negative for agitation, dysphoric mood and suicidal ideas. The patient is not nervous/anxious.     Per HPI unless specifically indicated above     Objective:    BP 108/68 (BP Location: Left Arm, Patient Position: Sitting, Cuff Size: Normal)   Pulse 82   Temp 97.7 F (36.5 C)   Ht 6' (1.829 m)   Wt 206 lb (93.4 kg)   SpO2 96%   BMI 27.94 kg/m   Wt Readings from Last 3 Encounters:  01/08/18 206 lb (93.4 kg)  12/05/17 207 lb 8  oz (94.1 kg)  03/15/16 191 lb 12.8 oz (87 kg)    Physical Exam  Constitutional: He is oriented to person, place, and time. He appears well-developed and well-nourished.  HENT:  Head: Normocephalic and atraumatic.  Neck: Neck supple.  Cardiovascular: Normal rate and regular rhythm.  Pulmonary/Chest: Effort normal and breath sounds normal. No respiratory distress. He has no wheezes.  Abdominal: Soft. Bowel sounds are normal. He exhibits no distension and no mass. There is no  hepatosplenomegaly. There is no tenderness. There is no rebound and no guarding.  Musculoskeletal: He exhibits no edema.  Lymphadenopathy:    He has no cervical adenopathy.  Neurological: He is alert and oriented to person, place, and time.  Skin: Skin is warm and dry.  Psychiatric: He has a normal mood and affect. His behavior is normal.  Vitals reviewed.       Assessment & Plan:   Encounter Diagnoses  Name Primary?  . Non-intractable vomiting, presence of nausea not specified, unspecified vomiting type Yes  . Cigarette nicotine dependence with nicotine-induced disorder   . Hyperlipidemia, unspecified hyperlipidemia type   . Alcohol abuse      -rx phenergan.   -Counseled pt to avoid driving on this due to sedation.  Also avoid etoh on phenergan -Counseled pt to stop etoh -Pt to go to GI appointment in August as scheduled -pt encouraged to submit everything for the [employer] application -follow up here 3 months.  RTO sooner prn

## 2018-01-08 NOTE — ED Notes (Signed)
Pt attempting to give urine sample. Informed pt sample was needed prior to giving him narcotic medication.

## 2018-01-08 NOTE — ED Notes (Signed)
Checked on pt for urine sample,pt states can't go right now,will check back in 30 minutes.Pt aware we need sample.

## 2018-01-08 NOTE — ED Triage Notes (Addendum)
Pt c/o abd cramping with n/v x 5 days. Pt states [facility] and was [facility] phenergan that is not helping. Pt also c/o left rib pain from fall over one week ago.

## 2018-01-09 MED ORDER — ONDANSETRON 4 MG PO TBDP: 4.0000 mg | ORAL_TABLET | Freq: Three times a day (TID) | ORAL | 0 refills | Status: DC | PRN

## 2018-01-09 MED ORDER — POTASSIUM CHLORIDE CRYS ER 20 MEQ PO TBCR: EXTENDED_RELEASE_TABLET | ORAL | 0 refills | Status: DC

## 2018-01-09 MED ORDER — POTASSIUM CHLORIDE CRYS ER 20 MEQ PO TBCR
20.0000 meq | EXTENDED_RELEASE_TABLET | Freq: Once | ORAL | Status: AC
  Administered 2018-01-09: 20 meq via ORAL
  Filled 2018-01-09: qty 1

## 2018-01-09 NOTE — ED Notes (Signed)
Pt seen walking down hall- pt says he is going outside, he is tired of lying down. Informed pt that he could walk around the nurses station a couple of times to stretch his legs, but was not allowed to go outside of ED and this would be considered leaving AMA. PT appears agitated.  Pt reports no pain at this time. Sophia, PA made aware.

## 2018-01-09 NOTE — ED Provider Notes (Signed)
[facility] EMERGENCY DEPARTMENT Provider Note   CSN: [id number] Arrival date & time: 01/08/18  2120     History   Chief Complaint Chief Complaint  Patient presents with  . Emesis    HPI Ian Roy is a 47 y.o. male.  The history is provided by the patient and a parent. No language interpreter was used.  Emesis   This is a new problem. The current episode started more than 1 week ago. The problem occurs 5 to 10 times per day. The problem has been gradually worsening. There has been no fever. Associated symptoms include abdominal pain and diarrhea.  Pt reports he fell a week ago and hit back and ribs in back.  Pt reports he has a bruise but pain is above bruise.   Past Medical History:  Diagnosis Date  . Alcohol use    daily  . Bronchitis   . GERD (gastroesophageal reflux disease)     Patient Active Problem List   Diagnosis Date Noted  . Elevated LFTs 03/15/2016  . Emesis 11/07/2015  . Esophageal reflux 11/07/2015  . Diarrhea 11/07/2015  . Alcohol abuse 11/02/2015  . Cigarette nicotine dependence without complication 99/35/7017    Past Surgical History:  Procedure Laterality Date  . None          Home Medications    Prior to Admission medications   Medication Sig Start Date End Date Taking? Authorizing Provider  bismuth subsalicylate (PEPTO BISMOL) 262 MG chewable tablet Chew 524 mg by mouth as needed.    [provider]  calcium carbonate (TUMS - DOSED IN MG ELEMENTAL CALCIUM) 500 MG chewable tablet Chew 2 tablets by mouth as needed for indigestion or heartburn.    [provider]  promethazine (PHENERGAN) 25 MG tablet Take 1 tablet (25 mg total) by mouth every 8 (eight) hours as needed for nausea or vomiting. 01/08/18   Soyla Dryer, PA-C  ranitidine (ZANTAC) 75 MG tablet Take 75 mg by mouth daily as needed for heartburn.    [provider]    Family History Family History  Problem Relation Age of Onset  . Cancer  Mother        breast  . Diabetes Mother   . Colon polyps Father   . Cancer Maternal Aunt   . Colon cancer Neg Hx     Social History Social History   Tobacco Use  . Smoking status: Current Every Day Smoker    Packs/day: 1.00    Years: 14.00    Pack years: 14.00    Types: Cigarettes  . Smokeless tobacco: Never Used  Substance Use Topics  . Alcohol use: Not Currently    Alcohol/week: 3.6 oz    Types: 6 Cans of beer per week    Comment: 6 pack once/ wk.  hx 12 beer / day  . Drug use: No     Allergies   Patient has no known allergies.   Review of Systems Review of Systems  Gastrointestinal: Positive for abdominal pain, diarrhea and vomiting.  All other systems reviewed and are negative.    Physical Exam Updated Vital Signs BP (!) 98/58   Pulse 62   Temp 98.3 F (36.8 C) (Oral)   Resp 17   Ht 6' (1.829 m)   Wt 93.9 kg (207 lb)   SpO2 100%   BMI 28.07 kg/m   Physical Exam  Constitutional: He appears well-developed and well-nourished.  HENT:  Head: Normocephalic.  Eyes: Pupils are equal,  round, and reactive to light. Conjunctivae are normal.  Neck: Normal range of motion.  Cardiovascular: Normal rate and regular rhythm.  Pulmonary/Chest: Effort normal.  Abdominal: Soft. There is tenderness.  Musculoskeletal: Normal range of motion.  Neurological: He is alert.  Skin: Skin is warm.  Psychiatric: He has a normal mood and affect.  Nursing note and vitals reviewed.    ED Treatments / Results  Labs (all labs ordered are listed, but only abnormal results are displayed) Labs Reviewed  COMPREHENSIVE METABOLIC PANEL - Abnormal; Notable for the following components:      Result Value   Sodium 134 (*)    Potassium 3.1 (*)    Chloride 97 (*)    ALT 51 (*)    All other components within normal limits  URINALYSIS, ROUTINE W REFLEX MICROSCOPIC - Abnormal; Notable for the following components:   Bilirubin Urine SMALL (*)    Ketones, ur 40 (*)    Protein, ur  TRACE (*)    All other components within normal limits  RAPID URINE DRUG SCREEN, HOSP PERFORMED - Abnormal; Notable for the following components:   Barbiturates   (*)    Value: Result not available. Reagent lot number recalled by manufacturer.   All other components within normal limits  URINALYSIS, MICROSCOPIC (REFLEX) - Abnormal; Notable for the following components:   Bacteria, UA RARE (*)    All other components within normal limits  LIPASE, BLOOD  CBC    EKG None  Radiology Dg Ribs Unilateral W/chest Left  Result Date: 01/08/2018 CLINICAL DATA:  [place] 1 week ago with posterior rib pain EXAM: LEFT RIBS AND CHEST - 3+ VIEW COMPARISON:  None. FINDINGS: No fracture or other bone lesions are seen involving the ribs. There is no evidence of pneumothorax or pleural effusion. Both lungs are clear. Heart size and mediastinal contours are within normal limits. IMPRESSION: Negative. Electronically Signed   By: Donavan Foil M.D.   On: 01/08/2018 22:31    Procedures Procedures (including critical care time)  Medications Ordered in ED Medications  sodium chloride 0.9 % bolus 1,000 mL (1,000 mLs Intravenous New Bag/Given 01/08/18 2315)  HYDROmorphone (DILAUDID) injection 1 mg (1 mg Intravenous Given 01/08/18 2317)  ondansetron (ZOFRAN) injection 4 mg (4 mg Intravenous Given 01/08/18 2317)  iopamidol (ISOVUE-300) 61 % injection 100 mL (100 mLs Intravenous Contrast Given 01/08/18 2331)     Initial Impression / Assessment and Plan / ED Course  I have reviewed the triage vital signs and the nursing notes.  Pertinent labs & imaging results that were available during my care of the patient were reviewed by me and considered in my medical decision making (see chart for details).     MDM wbc count is normal,  ALT is elevated to 51.  Potassium is 3.1. I suspect this may be contributing to pt's cramping.  Pt given potassium 20 meq  Pt given dilaudid 1 mg for pain.  Pt reports complete pain relief.   Ct  scan shows fluid fill loops of bowel, possible enteritis.   Final Clinical Impressions(s) / ED Diagnoses   Final diagnoses:  Non-intractable vomiting with nausea, unspecified vomiting type  Enteritis    ED Discharge Orders    None    An After Visit Summary was printed and given to the patient.    Fransico Meadow, [place] 01/09/18 5027    Merrily Pew, MD 01/09/18 [phone]

## 2018-01-09 NOTE — Discharge Instructions (Signed)
Avoid alcohol. Drink water and gatoraid.  Zofran for nausea.

## 2018-01-16 ENCOUNTER — Ambulatory Visit: Payer: Self-pay | Admitting: Physician Assistant

## 2018-02-04 ENCOUNTER — Encounter: Payer: Self-pay | Admitting: Student

## 2018-02-04 ENCOUNTER — Telehealth: Payer: Self-pay | Admitting: Student

## 2018-02-04 NOTE — Telephone Encounter (Signed)
Pt called back c/o "mole" he has had on his penis for 10 years that had become "sensitive" after a long period of sexual intercourse about 3-4 days ago. 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 Pt states mole had redness around the edges and was brown. Pt states it is no longer sensitive and is now the color of his skin.   Pt was offered to schedule an appointment for this week, but pt states he will call back later this week to schedule an appointment for next week

## 2018-02-04 NOTE — Telephone Encounter (Signed)
error 

## 2018-02-18 ENCOUNTER — Ambulatory Visit: Payer: Self-pay | Admitting: Nurse Practitioner

## 2018-02-18 ENCOUNTER — Other Ambulatory Visit: Payer: Self-pay

## 2018-02-18 ENCOUNTER — Encounter: Payer: Self-pay | Admitting: Nurse Practitioner

## 2018-02-18 VITALS — BP 106/69 | HR 82 | Temp 97.0°F | Ht 72.0 in | Wt 212.6 lb

## 2018-02-18 DIAGNOSIS — Z8371 Family history of colonic polyps: Secondary | ICD-10-CM

## 2018-02-18 DIAGNOSIS — R112 Nausea with vomiting, unspecified: Secondary | ICD-10-CM

## 2018-02-18 NOTE — Assessment & Plan Note (Signed)
The patient is a family history of colon polyps in his father who had multiple polyps at age 47.  Based on previous office visit he is due for colonoscopy.  He is agreeable to scheduling this now.  We will proceed.  Proceed with TCS on propofol/MAC with Dr. Gala Romney in near future: the risks, benefits, and alternatives have been discussed with the patient in detail. The patient states understanding and desires to proceed.  Patient is not on any anticoagulants, anxiolytics, chronic pain medications, or antidepressants.  Given his anxiety and and eccentric personality I feel he would benefit from propofol/MAC to promote adequate sedation.

## 2018-02-18 NOTE — Assessment & Plan Note (Signed)
Nausea, vomiting, abdominal pain is all resolved on their own after emergency room visit.  No GI complaints today.

## 2018-02-18 NOTE — Patient Instructions (Signed)
1. We will schedule your colonoscopy for you. 2. Further recommendations will be made after your colonoscopy. 3. Return for follow-up based on recommendations made after your colonoscopy. 4. Call us if you have any questions or concerns.  It was good to meet you today!  I hope you have a great summer!!

## 2018-02-18 NOTE — Progress Notes (Addendum)
Referring Provider: Soyla Dryer, PA-C Primary Care Physician:  Soyla Dryer, PA-C Primary GI:  Dr. Gala Romney  Chief Complaint  Patient presents with  . Emesis    better now    HPI:   Ian Roy is a 47 y.o. male who presents for evaluation of dysphasia.  The patient was last seen in our office 03/15/2016 for elevated LFTs.  At that time it was noted he has a history of abdominal pain, nausea, vomiting, diarrhea and likely was dealing with a self-limiting episode.  History of GERD in the setting of chronic alcohol use.  Very mild elevation of AST in May.  Recommended repeat labs.  His reflux is improved somewhat and only getting it in the evening about every few weeks, takes Tums as needed.  Drinks light beer in the evenings before bed and if he drinks a lot of beer one evening he will have a looser stool the next day.  Is been trying to cut back.  Father with history of colon polyps at age 60.  Recommended repeat labs, follow-up as needed for colonoscopy when due.  Not have his labs done.  A CMP completed 12/05/2017 (2 years later) found normal LFTs.  The referral provided by the primary care indicated diagnosis of dysphasia and family history of colon polyps.  Reviewed last office visit note completed 01/08/2018 by primary care which was seen for non-intractable nausea and vomiting.  At that time noted crampy stomach using over-the-counter Pepto-Bismol and occasionally ranitidine.  Last alcohol was last Thursday, thinking about quitting alcohol.  Gets a wave of nausea with bowel movement.  He was given a prescription for Phenergan, avoid alcohol, keep appointment with GI.  Seen in the emergency department 01/08/2018 for non-intractable nausea and vomiting and enteritis.  At that time he complained of nausea and vomiting which started a week prior occurring 5-10 times a day and gradual worsening.  No fever.  Associated abdominal pain and diarrhea.  Labs are essentially unremarkable.  CT  the abdomen and pelvis found no acute intra-abdominal or pelvic abnormality, a few fluid-filled loops of borderline prominent small bowel in the central to upper abdomen with possible enteritis, mildly thick-walled appearance of the bladder with under distention versus mild cystitis.  Also noted some mild hypokalemia with a potassium of 3.1 which could be contributing to cramping.  He is given potassium and Dilaudid for pain.  He was subsequently discharged home.  Today when I entered the exam room the patient was asleep and snoring and was difficult to awaken. He is a very difficult historian with difficulty answering questions and often falling asleep. States he was seen in the ER for N/V and was given Zofran for a few days which helped. Last episode of emesis was about a month ago. Denies any further abdominal pain. Denies hematochezia, melena, fever, chills, unintentional weight loss. Denies dysphagia, was having some "trouble swallowing" during week of frequent emesis. Denies chest pain, dyspnea, dizziness, lightheadedness, syncope, near syncope. Denies any other upper or lower GI symptoms.  Per review of last notes, patient is due for colonoscopy given father's history of colon polyps. He is ready to schedule this now.  When discussing colonoscopy risks and benefits he had very excentric and off-base/inappropritate questions including "If I wake up in the middle and enjoy it, does that make me gay?"  Past Medical History:  Diagnosis Date  . Alcohol use    daily  . Bronchitis   . GERD (gastroesophageal reflux  disease)     Past Surgical History:  Procedure Laterality Date  . None      Current Outpatient Medications  Medication Sig Dispense Refill  . bismuth subsalicylate (PEPTO BISMOL) 262 MG chewable tablet Chew 524 mg by mouth as needed.    . calcium carbonate (TUMS - DOSED IN MG ELEMENTAL CALCIUM) 500 MG chewable tablet Chew 2 tablets by mouth as needed for indigestion or heartburn.       No current facility-administered medications for this visit.     Allergies as of 02/18/2018  . (No Known Allergies)    Family History  Problem Relation Age of Onset  . Cancer Mother        breast  . Diabetes Mother   . Colon polyps Father   . Cancer Maternal Aunt   . Colon cancer Neg Hx     Social History   Socioeconomic History  . Marital status: Divorced    Spouse name: Not on file  . Number of children: Not on file  . Years of education: Not on file  . Highest education level: Not on file  Occupational History  . Occupation: CNA    Comment: prn  . Occupation: Animator  Social Needs  . Financial resource strain: Not on file  . Food insecurity:    Worry: Not on file    Inability: Not on file  . Transportation needs:    Medical: Not on file    Non-medical: Not on file  Tobacco Use  . Smoking status: Current Every Day Smoker    Packs/day: 1.00    Years: 14.00    Pack years: 14.00    Types: Cigarettes  . Smokeless tobacco: Never Used  Substance and Sexual Activity  . Alcohol use: Yes    Alcohol/week: 6.0 standard drinks    Types: 6 Cans of beer per week    Comment: Last ETOH 02/17/18 (6 pack) as of 12/25/17; 6 pack once/ wk.  hx 12 beer / day; 6 beers twice/week 02/18/18  . Drug use: No  . Sexual activity: Not on file  Lifestyle  . Physical activity:    Days per week: Not on file    Minutes per session: Not on file  . Stress: Not on file  Relationships  . Social connections:    Talks on phone: Not on file    Gets together: Not on file    Attends religious service: Not on file    Active member of club or organization: Not on file    Attends meetings of clubs or organizations: Not on file    Relationship status: Not on file  Other Topics Concern  . Not on file  Social History Narrative  . Not on file    Review of Systems: General: Negative for anorexia, weight loss, fever, chills, fatigue, weakness. Eyes: Negative for vision changes.  ENT:  Negative for hoarseness, difficulty swallowing , nasal congestion. CV: Negative for chest pain, angina, palpitations, dyspnea on exertion, peripheral edema.  Respiratory: Negative for dyspnea at rest, dyspnea on exertion, cough, sputum, wheezing.  GI: See history of present illness. GU:  Negative for dysuria, hematuria, urinary incontinence, urinary frequency, nocturnal urination.  MS: Negative for joint pain, low back pain.  Derm: Negative for rash or itching.  Neuro: Negative for weakness, abnormal sensation, seizure, frequent headaches, memory loss, confusion.  Psych: Negative for anxiety, depression, suicidal ideation, hallucinations.  Endo: Negative for unusual weight change.  Heme: Negative for bruising or bleeding. Allergy: Negative  for rash or hives.   Physical Exam: BP 106/69   Pulse 82   Temp (!) 97 F (36.1 C) (Oral)   Ht 6' (1.829 m)   Wt 212 lb 9.6 oz (96.4 kg)   BMI 28.83 kg/m  General:   Alert and oriented. Pleasant and cooperative. Well-nourished and well-developed.  Head:  Normocephalic and atraumatic. Eyes:  Without icterus, sclera clear and conjunctiva pink.  Ears:  Normal auditory acuity. Mouth:  No deformity or lesions, oral mucosa pink.  Throat/Neck:  Supple, without mass or thyromegaly. Cardiovascular:  S1, S2 present without murmurs appreciated. Normal pulses noted. Extremities without clubbing or edema. Respiratory:  Clear to auscultation bilaterally. No wheezes, rales, or rhonchi. No distress.  Gastrointestinal:  +BS, soft, non-tender and non-distended. No HSM noted. No guarding or rebound. No masses appreciated.  Rectal:  Deferred  Musculoskalatal:  Symmetrical without gross deformities. Normal posture. Skin:  Intact without significant lesions or rashes. Neurologic:  Alert and oriented x4;  grossly normal neurologically. Psych:  Alert and cooperative. Normal mood and affect. Heme/Lymph/Immune: No significant cervical adenopathy. No excessive bruising  noted.    02/18/2018 12:02 PM   Disclaimer: This note was dictated with voice recognition software. Similar sounding words can inadvertently be transcribed and may not be corrected upon review.

## 2018-02-18 NOTE — Progress Notes (Signed)
CC'ED TO PCP 

## 2018-02-19 ENCOUNTER — Telehealth: Payer: Self-pay

## 2018-02-19 NOTE — Telephone Encounter (Signed)
Tried to call pt to inform of pre-op appt 03/26/18 at 12:45pm, no answer, LMOVM. Letter mailed.

## 2018-03-26 ENCOUNTER — Encounter (HOSPITAL_COMMUNITY)
Admission: RE | Admit: 2018-03-26 | Discharge: 2018-03-26 | Disposition: A | Discharge status: Home / Self Care | Payer: Self-pay | Source: Ambulatory Visit | Attending: Internal Medicine | Admitting: Internal Medicine

## 2018-03-27 ENCOUNTER — Encounter (HOSPITAL_COMMUNITY): Payer: Self-pay

## 2018-04-01 ENCOUNTER — Ambulatory Visit (HOSPITAL_COMMUNITY)
Admission: RE | Admit: 2018-04-01 | Discharge: 2018-04-01 | Disposition: A | Discharge status: Home / Self Care | Payer: Self-pay | Source: Ambulatory Visit | Attending: Internal Medicine | Admitting: Internal Medicine

## 2018-04-01 ENCOUNTER — Ambulatory Visit (HOSPITAL_COMMUNITY): Payer: Self-pay | Admitting: Anesthesiology

## 2018-04-01 ENCOUNTER — Encounter (HOSPITAL_COMMUNITY): Payer: Self-pay

## 2018-04-01 ENCOUNTER — Encounter (HOSPITAL_COMMUNITY)
Admission: RE | Disposition: A | Discharge status: Home / Self Care | Payer: Self-pay | Source: Ambulatory Visit | Attending: Internal Medicine

## 2018-04-01 DIAGNOSIS — F17210 Nicotine dependence, cigarettes, uncomplicated: Secondary | ICD-10-CM | POA: Insufficient documentation

## 2018-04-01 DIAGNOSIS — D122 Benign neoplasm of ascending colon: Secondary | ICD-10-CM | POA: Insufficient documentation

## 2018-04-01 DIAGNOSIS — Z79899 Other long term (current) drug therapy: Secondary | ICD-10-CM | POA: Insufficient documentation

## 2018-04-01 DIAGNOSIS — D124 Benign neoplasm of descending colon: Secondary | ICD-10-CM | POA: Insufficient documentation

## 2018-04-01 DIAGNOSIS — R112 Nausea with vomiting, unspecified: Secondary | ICD-10-CM

## 2018-04-01 DIAGNOSIS — K648 Other hemorrhoids: Secondary | ICD-10-CM | POA: Insufficient documentation

## 2018-04-01 DIAGNOSIS — K219 Gastro-esophageal reflux disease without esophagitis: Secondary | ICD-10-CM | POA: Insufficient documentation

## 2018-04-01 DIAGNOSIS — Z8371 Family history of colonic polyps: Secondary | ICD-10-CM | POA: Insufficient documentation

## 2018-04-01 DIAGNOSIS — Z1211 Encounter for screening for malignant neoplasm of colon: Secondary | ICD-10-CM | POA: Insufficient documentation

## 2018-04-01 HISTORY — PX: POLYPECTOMY: SHX5525

## 2018-04-01 HISTORY — PX: COLONOSCOPY WITH PROPOFOL: SHX5780

## 2018-04-01 SURGERY — COLONOSCOPY WITH PROPOFOL
Anesthesia: Monitor Anesthesia Care

## 2018-04-01 MED ORDER — MEPERIDINE HCL 100 MG/ML IJ SOLN: 6.2500 mg | INTRAMUSCULAR | Status: DC | PRN

## 2018-04-01 MED ORDER — PROPOFOL 500 MG/50ML IV EMUL
INTRAVENOUS | Status: DC | PRN
  Administered 2018-04-01: 100 ug/kg/min via INTRAVENOUS

## 2018-04-01 MED ORDER — CHLORHEXIDINE GLUCONATE CLOTH 2 % EX PADS: 6.0000 | MEDICATED_PAD | Freq: Once | CUTANEOUS | Status: DC

## 2018-04-01 MED ORDER — LACTATED RINGERS IV SOLN: INTRAVENOUS | Status: DC

## 2018-04-01 MED ORDER — MIDAZOLAM HCL 2 MG/2ML IJ SOLN
INTRAMUSCULAR | Status: AC
  Filled 2018-04-01: qty 2

## 2018-04-01 MED ORDER — PROMETHAZINE HCL 25 MG/ML IJ SOLN: 6.2500 mg | INTRAMUSCULAR | Status: DC | PRN

## 2018-04-01 MED ORDER — HYDROCODONE-ACETAMINOPHEN 7.5-325 MG PO TABS: 1.0000 | ORAL_TABLET | Freq: Once | ORAL | Status: DC | PRN

## 2018-04-01 MED ORDER — PROPOFOL 10 MG/ML IV BOLUS
INTRAVENOUS | Status: AC
  Filled 2018-04-01: qty 40

## 2018-04-01 MED ORDER — FENTANYL CITRATE (PF) 100 MCG/2ML IJ SOLN
INTRAMUSCULAR | Status: DC | PRN
  Administered 2018-04-01: 100 ug via INTRAVENOUS

## 2018-04-01 MED ORDER — HYDROMORPHONE HCL 1 MG/ML IJ SOLN: 0.2500 mg | INTRAMUSCULAR | Status: DC | PRN

## 2018-04-01 MED ORDER — FENTANYL CITRATE (PF) 100 MCG/2ML IJ SOLN
INTRAMUSCULAR | Status: AC
  Filled 2018-04-01: qty 2

## 2018-04-01 MED ORDER — LACTATED RINGERS IV SOLN
INTRAVENOUS | Status: DC
  Administered 2018-04-01: 09:00:00 via INTRAVENOUS

## 2018-04-01 MED ORDER — PROPOFOL 10 MG/ML IV BOLUS
INTRAVENOUS | Status: DC | PRN
  Administered 2018-04-01: 40 mg via INTRAVENOUS

## 2018-04-01 MED ORDER — MIDAZOLAM HCL 5 MG/5ML IJ SOLN
INTRAMUSCULAR | Status: DC | PRN
  Administered 2018-04-01: 2 mg via INTRAVENOUS

## 2018-04-01 NOTE — Anesthesia Postprocedure Evaluation (Signed)
Anesthesia Post Note  Patient: Ian Roy  Procedure(s) Performed: COLONOSCOPY WITH PROPOFOL (N/A ) POLYPECTOMY  Patient location during evaluation: PACU Anesthesia Type: MAC Level of consciousness: awake and alert and patient cooperative Pain management: satisfactory to patient Vital Signs Assessment: post-procedure vital signs reviewed and stable Respiratory status: spontaneous breathing Cardiovascular status: stable Postop Assessment: no apparent nausea or vomiting Anesthetic complications: no     Last Vitals:  Vitals:   04/01/18 0825  BP: (!) 139/96  Pulse: 84  Resp: 11  Temp: 36.9 C  SpO2: 96%    Last Pain:  Vitals:   04/01/18 0825  TempSrc: Oral  PainSc: 2                  Jakari Sada

## 2018-04-01 NOTE — Discharge Instructions (Signed)
Colonoscopy Discharge Instructions  Read the instructions outlined below and refer to this sheet in the next few weeks. These discharge instructions provide you with general information on caring for yourself after you leave the hospital. Your doctor may also give you specific instructions. While your treatment has been planned according to the most current medical practices available, unavoidable complications occasionally occur. If you have any problems or questions after discharge, call Dr. Gala Romney at [phone]. ACTIVITY  You may resume your regular activity, but move at a slower pace for the next 24 hours.   Take frequent rest periods for the next 24 hours.   Walking will help get rid of the air and reduce the bloated feeling in your belly (abdomen).   No driving for 24 hours (because of the medicine (anesthesia) used during the test).    Do not sign any important legal documents or operate any machinery for 24 hours (because of the anesthesia used during the test).  NUTRITION  Drink plenty of fluids.   You may resume your normal diet as instructed by your doctor.   Begin with a light meal and progress to your normal diet. Heavy or fried foods are harder to digest and may make you feel sick to your stomach (nauseated).   Avoid alcoholic beverages for 24 hours or as instructed.  MEDICATIONS  You may resume your normal medications unless your doctor tells you otherwise.  WHAT YOU CAN EXPECT TODAY  Some feelings of bloating in the abdomen.   Passage of more gas than usual.   Spotting of blood in your stool or on the toilet paper.  IF YOU HAD POLYPS REMOVED DURING THE COLONOSCOPY:  No aspirin products for 7 days or as instructed.   No alcohol for 7 days or as instructed.   Eat a soft diet for the next 24 hours.  FINDING OUT THE RESULTS OF YOUR TEST Not all test results are available during your visit. If your test results are not back during the visit, make an appointment  with your caregiver to find out the results. Do not assume everything is normal if you have not heard from your caregiver or the medical facility. It is important for you to follow up on all of your test results.  SEEK IMMEDIATE MEDICAL ATTENTION IF:  You have more than a spotting of blood in your stool.   Your belly is swollen (abdominal distention).   You are nauseated or vomiting.   You have a temperature over 101.   You have abdominal pain or discomfort that is severe or gets worse throughout the day.    Colon polyp information provided  Further recommendations to follow pending review of pathology report  Office visit with [place] in 6 weeks.   Monitored Anesthesia Care Anesthesia is a term that refers to techniques, procedures, and medicines that help a person stay safe and comfortable during a medical procedure. Monitored anesthesia care, or sedation, is one type of anesthesia. Your anesthesia specialist may recommend sedation if you will be having a procedure that does not require you to be unconscious, such as:  Cataract surgery.  A dental procedure.  A biopsy.  A colonoscopy.  During the procedure, you may receive a medicine to help you relax (sedative). There are three levels of sedation:  Mild sedation. At this level, you may feel awake and relaxed. You will be able to follow directions.  Moderate sedation. At this level, you will be sleepy. You may not remember the  procedure.  Deep sedation. At this level, you will be asleep. You will not remember the procedure.  The more medicine you are given, the deeper your level of sedation will be. Depending on how you respond to the procedure, the anesthesia specialist may change your level of sedation or the type of anesthesia to fit your needs. An anesthesia specialist will monitor you closely during the procedure. Let your health care provider know about:  Any allergies you have.  All medicines you are taking, including  vitamins, herbs, eye drops, creams, and over-the-counter medicines.  Any use of steroids (by mouth or as a cream).  Any problems you or family members have had with sedatives and anesthetic medicines.  Any blood disorders you have.  Any surgeries you have had.  Any medical conditions you have, such as sleep apnea.  Whether you are pregnant or may be pregnant.  Any use of cigarettes, alcohol, or street drugs. What are the risks? Generally, this is a safe procedure. However, problems may occur, including:  Getting too much medicine (oversedation).  Nausea.  Allergic reaction to medicines.  Trouble breathing. If this happens, a breathing tube may be used to help with breathing. It will be removed when you are awake and breathing on your own.  Heart trouble.  Lung trouble.  Before the procedure Staying hydrated Follow instructions from your health care provider about hydration, which may include:  Up to 2 hours before the procedure - you may continue to drink clear liquids, such as water, clear fruit juice, black coffee, and plain tea.  Eating and drinking restrictions Follow instructions from your health care provider about eating and drinking, which may include:  8 hours before the procedure - stop eating heavy meals or foods such as meat, fried foods, or fatty foods.  6 hours before the procedure - stop eating light meals or foods, such as toast or cereal.  6 hours before the procedure - stop drinking milk or drinks that contain milk.  2 hours before the procedure - stop drinking clear liquids.  Medicines Ask your health care provider about:  Changing or stopping your regular medicines. This is especially important if you are taking diabetes medicines or blood thinners.  Taking medicines such as aspirin and ibuprofen. These medicines can thin your blood. Do not take these medicines before your procedure if your health care provider instructs you not to.  Tests and  exams  You will have a physical exam.  You may have blood tests done to show: ? How well your kidneys and liver are working. ? How well your blood can clot.  General instructions  Plan to have someone take you home from the hospital or clinic.  If you will be going home right after the procedure, plan to have someone with you for 24 hours.  What happens during the procedure?  Your blood pressure, heart rate, breathing, level of pain and overall condition will be monitored.  An IV tube will be inserted into one of your veins.  Your anesthesia specialist will give you medicines as needed to keep you comfortable during the procedure. This may mean changing the level of sedation.  The procedure will be performed. After the procedure  Your blood pressure, heart rate, breathing rate, and blood oxygen level will be monitored until the medicines you were given have worn off.  Do not drive for 24 hours if you received a sedative.  You may: ? Feel sleepy, clumsy, or nauseous. ? Feel  forgetful about what happened after the procedure. ? Have a sore throat if you had a breathing tube during the procedure. ? Vomit. This information is not intended to replace advice given to you by your health care provider. Make sure you discuss any questions you have with your health care provider. Document Released: 03/22/2005 Document Revised: 12/03/2015 Document Reviewed: 10/17/2015 Elsevier Interactive Patient Education  2018 [employer].    Colonoscopy, Adult, Care After This sheet gives you information about how to care for yourself after your procedure. Your doctor may also give you more specific instructions. If you have problems or questions, call your doctor. Follow these instructions at home: General instructions   For the first 24 hours after the procedure: ? Do not drive or use machinery. ? Do not sign important documents. ? Do not drink alcohol. ? Do your daily activities more  slowly than normal. ? Eat foods that are soft and easy to digest. ? Rest often.  Take over-the-counter or prescription medicines only as told by your doctor.  It is up to you to get the results of your procedure. Ask your doctor, or the department performing the procedure, when your results will be ready. To help cramping and bloating:  Try walking around.  Put heat on your belly (abdomen) as told by your doctor. Use a heat source that your doctor recommends, such as a moist heat pack or a heating pad. ? Put a towel between your skin and the heat source. ? Leave the heat on for 20-30 minutes. ? Remove the heat if your skin turns bright red. This is especially important if you cannot feel pain, heat, or cold. You can get burned. Eating and drinking  Drink enough fluid to keep your pee (urine) clear or pale yellow.  Return to your normal diet as told by your doctor. Avoid heavy or fried foods that are hard to digest.  Avoid drinking alcohol for as long as told by your doctor. Contact a doctor if:  You have blood in your poop (stool) 2-3 days after the procedure. Get help right away if:  You have more than a small amount of blood in your poop.  You see large clumps of tissue (blood clots) in your poop.  Your belly is swollen.  You feel sick to your stomach (nauseous).  You throw up (vomit).  You have a fever.  You have belly pain that gets worse, and medicine does not help your pain. This information is not intended to replace advice given to you by your health care provider. Make sure you discuss any questions you have with your health care provider. Document Released: 07/29/2010 Document Revised: 03/20/2016 Document Reviewed: 03/20/2016 Elsevier Interactive Patient Education  2017 [employer].    Colon Polyps Polyps are tissue growths inside the body. Polyps can grow in many places, including the large intestine (colon). A polyp may be a round bump or a mushroom-shaped  growth. You could have one polyp or several. Most colon polyps are noncancerous (benign). However, some colon polyps can become cancerous over time. What are the causes? The exact cause of colon polyps is not known. What increases the risk? This condition is more likely to develop in people who:  Have a family history of colon cancer or colon polyps.  Are older than 90 or older than 45 if they are African American.  Have inflammatory bowel disease, such as ulcerative colitis or Crohn disease.  Are overweight.  Smoke cigarettes.  Do not  get enough exercise.  Drink too much alcohol.  Eat a diet that is: ? High in fat and red meat. ? Low in fiber.  Had childhood cancer that was treated with abdominal radiation.  What are the signs or symptoms? Most polyps do not cause symptoms. If you have symptoms, they may include:  Blood coming from your rectum when having a bowel movement.  Blood in your stool.The stool may look dark red or black.  A change in bowel habits, such as constipation or diarrhea.  How is this diagnosed? This condition is diagnosed with a colonoscopy. This is a procedure that uses a lighted, flexible scope to look at the inside of your colon. How is this treated? Treatment for this condition involves removing any polyps that are found. Those polyps will then be tested for cancer. If cancer is found, your health care provider will talk to you about options for colon cancer treatment. Follow these instructions at home: Diet  Eat plenty of fiber, such as fruits, vegetables, and whole grains.  Eat foods that are high in calcium and vitamin D, such as milk, cheese, yogurt, eggs, liver, fish, and broccoli.  Limit foods high in fat, red meats, and processed meats, such as hot dogs, sausage, bacon, and lunch meats.  Maintain a healthy weight, or lose weight if recommended by your health care provider. General instructions  Do not smoke cigarettes.  Do not  drink alcohol excessively.  Keep all follow-up visits as told by your health care provider. This is important. This includes keeping regularly scheduled colonoscopies. Talk to your health care provider about when you need a colonoscopy.  Exercise every day or as told by your health care provider. Contact a health care provider if:  You have new or worsening bleeding during a bowel movement.  You have new or increased blood in your stool.  You have a change in bowel habits.  You unexpectedly lose weight. This information is not intended to replace advice given to you by your health care provider. Make sure you discuss any questions you have with your health care provider. Document Released: 03/22/2004 Document Revised: 12/02/2015 Document Reviewed: 05/17/2015 Elsevier Interactive Patient Education  [employer].

## 2018-04-01 NOTE — Anesthesia Preprocedure Evaluation (Signed)
Anesthesia Evaluation  Patient identified by MRN, date of birth, ID band Patient awake    Reviewed: Allergy & Precautions, H&P , NPO status , Patient's Chart, lab work & pertinent test results, reviewed documented beta blocker date and time   Airway Mallampati: II  TM Distance: >3 FB Neck ROM: full    Dental no notable dental hx. (+) Teeth Intact, Dental Advidsory Given   Pulmonary neg pulmonary ROS, Current Smoker,    Pulmonary exam normal breath sounds clear to auscultation       Cardiovascular Exercise Tolerance: Good negative cardio ROS   Rhythm:regular Rate:Normal     Neuro/Psych PSYCHIATRIC DISORDERS negative neurological ROS  negative psych ROS   GI/Hepatic negative GI ROS, Neg liver ROS, GERD  ,  Endo/Other  negative endocrine ROS  Renal/GU negative Renal ROS  negative genitourinary   Musculoskeletal   Abdominal   Peds  Hematology negative hematology ROS (+)   Anesthesia Other Findings ETOH abuse Tobacco abuse GERD w/ emesis bronchitis  Reproductive/Obstetrics negative OB ROS                             Anesthesia Physical Anesthesia Plan  ASA: III  Anesthesia Plan: MAC   Post-op Pain Management:    Induction:   PONV Risk Score and Plan:   Airway Management Planned:   Additional Equipment:   Intra-op Plan:   Post-operative Plan:   Informed Consent: I have reviewed the patients History and Physical, chart, labs and discussed the procedure including the risks, benefits and alternatives for the proposed anesthesia with the patient or authorized representative who has indicated his/her understanding and acceptance.   Dental Advisory Given  Plan Discussed with: CRNA and Anesthesiologist  Anesthesia Plan Comments:         Anesthesia Quick Evaluation

## 2018-04-01 NOTE — H&P (Signed)
@LOGO @   Primary Care Physician:  Soyla Dryer, PA-C Primary Gastroenterologist:  Dr. Gala Romney Pre-Procedure History & Physical: HPI:  Ian Roy is a 47 y.o. male here for for high-risk screening colonoscopy - family history of polyps.  Nausea and vomiting currently not an issue as patient reports.  Past Medical History:  Diagnosis Date  . Alcohol use    daily  . Bronchitis   . GERD (gastroesophageal reflux disease)     Past Surgical History:  Procedure Laterality Date  . None      Prior to Admission medications   Medication Sig Start Date End Date Taking? Authorizing Provider  Tetrahydrozoline HCl (VISINE OP) Place 1 drop into both eyes daily as needed (dry eyes).   Yes [provider]    Allergies as of 02/18/2018  . (No Known Allergies)    Family History  Problem Relation Age of Onset  . Cancer Mother        breast  . Diabetes Mother   . Colon polyps Father   . Cancer Maternal Aunt   . Colon cancer Neg Hx     Social History   Socioeconomic History  . Marital status: Divorced    Spouse name: Not on file  . Number of children: Not on file  . Years of education: Not on file  . Highest education level: Not on file  Occupational History  . Occupation: CNA    Comment: prn  . Occupation: Animator  Social Needs  . Financial resource strain: Not on file  . Food insecurity:    Worry: Not on file    Inability: Not on file  . Transportation needs:    Medical: Not on file    Non-medical: Not on file  Tobacco Use  . Smoking status: Current Every Day Smoker    Packs/day: 1.00    Years: 14.00    Pack years: 14.00    Types: Cigarettes  . Smokeless tobacco: Never Used  Substance and Sexual Activity  . Alcohol use: Yes    Alcohol/week: 6.0 standard drinks    Types: 6 Cans of beer per week    Comment: Last ETOH 02/17/18 (6 pack) as of 12/25/17; 6 pack once/ wk.  hx 12 beer / day; 6 beers twice/week 02/18/18  . Drug use: No  . Sexual activity:  Not on file  Lifestyle  . Physical activity:    Days per week: Not on file    Minutes per session: Not on file  . Stress: Not on file  Relationships  . Social connections:    Talks on phone: Not on file    Gets together: Not on file    Attends religious service: Not on file    Active member of club or organization: Not on file    Attends meetings of clubs or organizations: Not on file    Relationship status: Not on file  . Intimate partner violence:    Fear of current or ex partner: Not on file    Emotionally abused: Not on file    Physically abused: Not on file    Forced sexual activity: Not on file  Other Topics Concern  . Not on file  Social History Narrative  . Not on file    Review of Systems: See HPI, otherwise negative ROS  Physical Exam: BP (!) 139/96   Pulse 84   Temp 98.4 F (36.9 C) (Oral)   Resp 11   SpO2 96%  General:   Alert,  Well-developed, well-nourished, pleasant and cooperative in NAD Lungs:  Clear throughout to auscultation.   No wheezes, crackles, or rhonchi. No acute distress. Heart:  Regular rate and rhythm; no murmurs, clicks, rubs,  or gallops. Abdomen: Non-distended, normal bowel sounds.  Soft and nontender without appreciable mass or hepatosplenomegaly.  Pulses:  Normal pulses noted. Extremities:  Without clubbing or edema.  Impression/Plan: 47 year old gentleman with a positive family history of polyps.Here for high risk screening colonoscopy.   The risks, benefits, limitations, alternatives and imponderables have been reviewed with the patient. Questions have been answered. All parties are agreeable.     Notice: This dictation was prepared with Dragon dictation along with smaller phrase technology. Any transcriptional errors that result from this process are unintentional and may not be corrected upon review.

## 2018-04-01 NOTE — Transfer of Care (Signed)
Immediate Anesthesia Transfer of Care Note  Patient: Ian Roy  Procedure(s) Performed: COLONOSCOPY WITH PROPOFOL (N/A ) POLYPECTOMY  Patient Location: PACU  Anesthesia Type:MAC  Level of Consciousness: awake, alert  and patient cooperative  Airway & Oxygen Therapy: Patient Spontanous Breathing  Post-op Assessment: Report given to RN and Post -op Vital signs reviewed and stable  Post vital signs: Reviewed and stable  Last Vitals:  Vitals Value Taken Time  BP    Temp    Pulse    Resp    SpO2      Last Pain:  Vitals:   04/01/18 0825  TempSrc: Oral  PainSc: 2          Complications: No apparent anesthesia complications

## 2018-04-01 NOTE — Op Note (Signed)
[facility] Patient Name: Ian Roy Procedure Date: 04/01/2018 9:10 AM MRN: [id number] Date of Birth: 07/12/1970 Attending MD: Norvel Richards , MD CSN: [id number] Age: 47 Admit Type: Outpatient Procedure:                Colonoscopy Indications:              Colon cancer screening in patient at increased                            risk: Family history of 1st-degree relative with                            colon polyps Providers:                Norvel Richards, MD, Janeece Riggers, RN, Nelma Rothman, Technician Referring MD:              Medicines:                Propofol per Anesthesia Complications:            No immediate complications. Estimated Blood Loss:     Estimated blood loss was minimal. Procedure:                Pre-Anesthesia Assessment:                           - Prior to the procedure, a History and Physical                            was performed, and patient medications and                            allergies were reviewed. The patient's tolerance of                            previous anesthesia was also reviewed. The risks                            and benefits of the procedure and the sedation                            options and risks were discussed with the patient.                            All questions were answered, and informed consent                            was obtained. Prior Anticoagulants: The patient has                            taken no previous anticoagulant or antiplatelet                            agents.  ASA Grade Assessment: II - A patient with                            mild systemic disease. After reviewing the risks                            and benefits, the patient was deemed in                            satisfactory condition to undergo the procedure.                           After obtaining informed consent, the colonoscope                            was passed under direct vision.  Throughout the                            procedure, the patient's blood pressure, pulse, and                            oxygen saturations were monitored continuously. The                            CF-HQ190L ([id number]) scope was introduced through                            the anus and advanced to the the cecum, identified                            by appendiceal orifice and ileocecal valve. The                            colonoscopy was performed without difficulty. The                            patient tolerated the procedure well. The quality                            of the bowel preparation was adequate. The                            ileocecal valve, appendiceal orifice, and rectum                            were photographed. The entire colon was well                            visualized. Scope In: 9:28:18 AM Scope Out: 9:42:59 AM Scope Withdrawal Time: 0 hours 12 minutes 43 seconds  Total Procedure Duration: 0 hours 14 minutes 41 seconds  Findings:      The perianal and digital rectal examinations were normal.      A 8 mm polyp was found in the ascending colon. The  polyp was sessile.       The polyp was removed with a hot snare. Resection and retrieval were       complete. Estimated blood loss: none.      Two sessile polyps were found in the descending colon. The polyps were 5       to 6 mm in size. These polyps were removed with a cold snare. Resection       and retrieval were complete. Estimated blood loss was minimal.      Non-bleeding internal hemorrhoids were found during retroflexion.      The exam was otherwise without abnormality on direct and retroflexion       views. Impression:               - One 8 mm polyp in the ascending colon, removed                            with a hot snare. Resected and retrieved.                           - Two 5 to 6 mm polyps in the descending colon,                            removed with a cold snare. Resected and retrieved.                            - Non-bleeding internal hemorrhoids.                           - The examination was otherwise normal on direct                            and retroflexion views. Moderate Sedation:      Moderate (conscious) sedation was personally administered by an       anesthesia professional. The following parameters were monitored: oxygen       saturation, heart rate, blood pressure, respiratory rate, EKG, adequacy       of pulmonary ventilation, and response to care. Total physician       intraservice time was 20 minutes. Recommendation:           - The signs and symptoms of potential delayed                            complications were discussed with the patient.                           - Patient has a contact number available for                            emergencies.                           - Return to normal activities tomorrow.                           - Resume previous diet.                           -  Continue present medications.                           - Repeat colonoscopy date to be determined after                            pending pathology results are reviewed for                            surveillance based on pathology results.                           - Return to GI office in 6 weeks. Procedure Code(s):        --- Professional ---                           [phone], Colonoscopy, flexible; with removal of                            tumor(s), polyp(s), or other lesion(s) by snare                            technique Diagnosis Code(s):        --- Professional ---                           Z83.71, Family history of colonic polyps                           D12.2, Benign neoplasm of ascending colon                           D12.4, Benign neoplasm of descending colon                           K64.8, Other hemorrhoids CPT copyright 2017 [employer]. All rights reserved. The codes documented in this report are preliminary and upon coder review  may  be revised to meet current compliance requirements. Cristopher Estimable. , MD Norvel Richards, MD 04/01/2018 9:47:58 AM This report has been signed electronically. Number of Addenda: 0

## 2018-04-02 ENCOUNTER — Encounter (HOSPITAL_COMMUNITY): Payer: Self-pay | Admitting: Emergency Medicine

## 2018-04-02 ENCOUNTER — Telehealth: Payer: Self-pay | Admitting: Internal Medicine

## 2018-04-02 ENCOUNTER — Encounter: Payer: Self-pay | Admitting: Internal Medicine

## 2018-04-02 ENCOUNTER — Emergency Department (HOSPITAL_COMMUNITY)
Admission: EM | Admit: 2018-04-02 | Discharge: 2018-04-02 | Disposition: A | Discharge status: Home / Self Care | Payer: Self-pay | Attending: Emergency Medicine | Admitting: Emergency Medicine

## 2018-04-02 DIAGNOSIS — F17210 Nicotine dependence, cigarettes, uncomplicated: Secondary | ICD-10-CM | POA: Insufficient documentation

## 2018-04-02 DIAGNOSIS — R112 Nausea with vomiting, unspecified: Secondary | ICD-10-CM | POA: Insufficient documentation

## 2018-04-02 DIAGNOSIS — R1013 Epigastric pain: Secondary | ICD-10-CM | POA: Insufficient documentation

## 2018-04-02 LAB — COMPREHENSIVE METABOLIC PANEL
ALBUMIN: 4.5 g/dL (ref 3.5–5.0)
ALK PHOS: 54 U/L (ref 38–126)
ALT: 25 U/L (ref 0–44)
AST: 24 U/L (ref 15–41)
Anion gap: 14 (ref 5–15)
BILIRUBIN TOTAL: 1.1 mg/dL (ref 0.3–1.2)
BUN: 10 mg/dL (ref 6–20)
CALCIUM: 9.7 mg/dL (ref 8.9–10.3)
CO2: 21 mmol/L — ABNORMAL LOW (ref 22–32)
Chloride: 100 mmol/L (ref 98–111)
Creatinine, Ser: 1.05 mg/dL (ref 0.61–1.24)
GFR calc Af Amer: >60 mL/min (ref >60)
GFR calc non Af Amer: >60 mL/min (ref >60)
GLUCOSE: 129 mg/dL — AB (ref 70–99)
Potassium: 3.4 mmol/L — ABNORMAL LOW (ref 3.5–5.1)
Sodium: 135 mmol/L (ref 135–145)
TOTAL PROTEIN: 7.9 g/dL (ref 6.5–8.1)

## 2018-04-02 LAB — CBC
HEMATOCRIT: 52.7 % — AB (ref 39.0–52.0)
Hemoglobin: 18.8 g/dL — ABNORMAL HIGH (ref 13.0–17.0)
MCH: 33.8 pg (ref 26.0–34.0)
MCHC: 35.7 g/dL (ref 30.0–36.0)
MCV: 94.8 fL (ref 78.0–100.0)
Platelets: 304 10*3/uL (ref 150–400)
RBC: 5.56 MIL/uL (ref 4.22–5.81)
RDW: 12.3 % (ref 11.5–15.5)
WBC: 10 10*3/uL (ref 4.0–10.5)

## 2018-04-02 LAB — LIPASE, BLOOD: Lipase: 21 U/L (ref 11–51)

## 2018-04-02 MED ORDER — SODIUM CHLORIDE 0.9 % IV BOLUS
1000.0000 mL | Freq: Once | INTRAVENOUS | Status: AC
  Administered 2018-04-02: 1000 mL via INTRAVENOUS

## 2018-04-02 MED ORDER — OXYCODONE-ACETAMINOPHEN 5-325 MG PO TABS: 1.0000 | ORAL_TABLET | Freq: Four times a day (QID) | ORAL | 0 refills | Status: DC | PRN

## 2018-04-02 MED ORDER — ONDANSETRON HCL 4 MG/2ML IJ SOLN
4.0000 mg | Freq: Once | INTRAMUSCULAR | Status: AC
  Administered 2018-04-02: 4 mg via INTRAVENOUS
  Filled 2018-04-02: qty 2

## 2018-04-02 MED ORDER — HYDROMORPHONE HCL 1 MG/ML IJ SOLN
1.0000 mg | Freq: Once | INTRAMUSCULAR | Status: AC
  Administered 2018-04-02: 1 mg via INTRAVENOUS
  Filled 2018-04-02: qty 1

## 2018-04-02 MED ORDER — PANTOPRAZOLE SODIUM 20 MG PO TBEC: 20.0000 mg | DELAYED_RELEASE_TABLET | Freq: Every day | ORAL | 0 refills | Status: DC

## 2018-04-02 MED ORDER — LORAZEPAM 2 MG/ML IJ SOLN
1.0000 mg | Freq: Once | INTRAMUSCULAR | Status: AC
  Administered 2018-04-02: 1 mg via INTRAVENOUS
  Filled 2018-04-02: qty 1

## 2018-04-02 MED ORDER — FAMOTIDINE IN NACL 20-0.9 MG/50ML-% IV SOLN
20.0000 mg | Freq: Once | INTRAVENOUS | Status: AC
  Administered 2018-04-02: 20 mg via INTRAVENOUS
  Filled 2018-04-02: qty 50

## 2018-04-02 NOTE — ED Provider Notes (Signed)
[facility] EMERGENCY DEPARTMENT Provider Note   CSN: [id number] Arrival date & time: 04/02/18  1325     History   Chief Complaint Chief Complaint  Patient presents with  . Abdominal Pain    HPI Ian Roy is a 47 y.o. male.  Patient states he has multiple episodes in the past of severe epigastric abdominal pain and multiple episodes of vomiting that go on for stretches of a week or 2 at a time.  Michela Pitcher he has been in the ED multiple times for this and has had multiple CAT scans nobody can find a cause for his pain.  He is in again another stretch of this and also had a prearranged colonoscopy yesterday.  He continues to have vomiting and pain and called his GI doctor who could not do anything for him over the phone and so he came here.  He is complaining of 10 out of 10 sharp stabbing pain in his upper abdomen associated with nonbloody vomiting.  He takes Tylenol for the pain and does not help.  He drinks alcohol frequently and smokes and is not on a PPI.  The history is provided by the patient.  Abdominal Pain   This is a recurrent problem. The current episode started yesterday. The problem occurs constantly. The problem has not changed since onset.The pain is associated with alcohol use. The pain is located in the generalized abdominal region and epigastric region. The quality of the pain is sharp. The pain is at a severity of 10/10. Associated symptoms include nausea and vomiting. Pertinent negatives include fever, diarrhea, hematochezia, melena, constipation, dysuria, frequency and headaches. Nothing aggravates the symptoms. Nothing relieves the symptoms. Past workup includes CT scan. His past medical history is significant for GERD.    Past Medical History:  Diagnosis Date  . Alcohol use    daily  . Bronchitis   . GERD (gastroesophageal reflux disease)     Patient Active Problem List   Diagnosis Date Noted  . Family history of colonic polyps 02/18/2018  . Elevated LFTs  03/15/2016  . Emesis 11/07/2015  . Esophageal reflux 11/07/2015  . Diarrhea 11/07/2015  . Alcohol abuse 11/02/2015  . Cigarette nicotine dependence without complication 78/58/8502    Past Surgical History:  Procedure Laterality Date  . colonoscopy    . None          Home Medications    Prior to Admission medications   Medication Sig Start Date End Date Taking? Authorizing Provider  Tetrahydrozoline HCl (VISINE OP) Place 1 drop into both eyes daily as needed (dry eyes).    [provider]    Family History Family History  Problem Relation Age of Onset  . Cancer Mother        breast  . Diabetes Mother   . Colon polyps Father   . Cancer Maternal Aunt   . Colon cancer Neg Hx     Social History Social History   Tobacco Use  . Smoking status: Current Every Day Smoker    Packs/day: 1.00    Years: 14.00    Pack years: 14.00    Types: Cigarettes  . Smokeless tobacco: Never Used  Substance Use Topics  . Alcohol use: Yes    Alcohol/week: 6.0 standard drinks    Types: 6 Cans of beer per week    Comment: Last ETOH 04/02/18  . Drug use: No     Allergies   Patient has no known allergies.   Review of Systems  Review of Systems  Constitutional: Negative for fever.  HENT: Negative for sore throat.   Eyes: Negative for visual disturbance.  Respiratory: Negative for shortness of breath.   Cardiovascular: Negative for chest pain.  Gastrointestinal: Positive for abdominal pain, nausea and vomiting. Negative for constipation, diarrhea, hematochezia and melena.  Genitourinary: Negative for dysuria and frequency.  Musculoskeletal: Negative for neck pain.  Skin: Negative for rash.  Neurological: Negative for headaches.     Physical Exam Updated Vital Signs BP (!) 146/98 (BP Location: Right Arm)   Pulse 88   Temp 97.6 F (36.4 C) (Oral)   Resp 20   Physical Exam  Constitutional: He appears well-developed and well-nourished.  HENT:  Head: Normocephalic  and atraumatic.  Eyes: Conjunctivae are normal.  Neck: Neck supple.  Cardiovascular: Normal rate and regular rhythm.  No murmur heard. Pulmonary/Chest: Effort normal and breath sounds normal. No respiratory distress.  Abdominal: Soft. Normal appearance. There is generalized tenderness and tenderness in the epigastric area. There is no rigidity and no guarding.  Musculoskeletal: He exhibits no edema or deformity.  Neurological: He is alert.  Skin: Skin is warm and dry. Capillary refill takes less than 2 seconds.  Psychiatric: He has a normal mood and affect.  Nursing note and vitals reviewed.    ED Treatments / Results  Labs (all labs ordered are listed, but only abnormal results are displayed) Labs Reviewed  COMPREHENSIVE METABOLIC PANEL - Abnormal; Notable for the following components:      Result Value   Potassium 3.4 (*)    CO2 21 (*)    Glucose, Bld 129 (*)    All other components within normal limits  CBC - Abnormal; Notable for the following components:   Hemoglobin 18.8 (*)    HCT 52.7 (*)    All other components within normal limits  LIPASE, BLOOD    EKG None  Radiology No results found.  Procedures Procedures (including critical care time)  Medications Ordered in ED Medications  famotidine (PEPCID) IVPB 20 mg premix (has no administration in time range)  ondansetron (ZOFRAN) injection 4 mg (has no administration in time range)  HYDROmorphone (DILAUDID) injection 1 mg (has no administration in time range)  sodium chloride 0.9 % bolus 1,000 mL (has no administration in time range)     Initial Impression / Assessment and Plan / ED Course  I have reviewed the triage vital signs and the nursing notes.  Pertinent labs & imaging results that were available during my care of the patient were reviewed by me and considered in my medical decision making (see chart for details).  Clinical Course as of Apr 02 2330  Tue Apr 02, 2018  1754 patients symptoms are much  improved.  His lab work was unremarkable.  I think the plan would be to get him on a PPI and bridge him very short-term with some pain medicine and try to get him back with GI so they can do an upper endoscopy.   [MB]    Clinical Course User Index [MB] Hayden Rasmussen, MD     Final Clinical Impressions(s) / ED Diagnoses   Final diagnoses:  Epigastric pain  Non-intractable vomiting with nausea, unspecified vomiting type    ED Discharge Orders    None       Hayden Rasmussen, MD 04/02/18 2332

## 2018-04-02 NOTE — ED Triage Notes (Signed)
Pt reports used to drink alcohol heavily and states did drink heavily Saturday night.

## 2018-04-02 NOTE — ED Notes (Signed)
Pt informed of wait. Blanket given offered Zofran and patient refused. Pt requesting something to drink. Informed patient that we can't offer fluids by mouth until he is checked out by EDP.

## 2018-04-02 NOTE — Telephone Encounter (Signed)
Noted. Will check on pt tomorrow. Pt went to the ED.

## 2018-04-02 NOTE — ED Triage Notes (Signed)
Pt presents with left upper abd pain started yesterday. Pt states had colonoscopy yesterday and "they wouldn't give me any dialudid"  He has had this before and reports they never can find anything.

## 2018-04-02 NOTE — Telephone Encounter (Signed)
Communication noted.  I reviewed seizure report from yesterday.  Pathology pending.  Nausea and vomiting symptoms had settled down when I interviewed him just prior to the procedure yesterday.  I suppose he could have an unusual reaction to anesthesia.  Do not think he needs an emergency EGD although he may need evaluation of his upper GI tract at some point in the future.  Back off on a clear liquid diet today.  Offer Phenergan 25 mg suppositories dispense (6) -1 per rectum every 8 hours as needed for nausea.  No refills. Pain medications not indicated acutely.  Please make sure that he has a follow-up appointment in the coming weeks with an extender.  I will send him a letter  once pathology report is back for review.

## 2018-04-02 NOTE — Telephone Encounter (Signed)
[phone]  PATIENT HAVING A "THROWING UP SPELL" THAT HE TOLD RMR ABOUT.  HE IS WANTING SOMETHING FOR PAIN .Marland Kitchen PLEASE CALL

## 2018-04-02 NOTE — Telephone Encounter (Signed)
Noted  

## 2018-04-02 NOTE — Telephone Encounter (Signed)
PATIENT CALLED AGAIN NEEDING SOMETHING FOR PAIN.

## 2018-04-02 NOTE — Telephone Encounter (Signed)
Pt had TCS yesterday and having a vomiting spell that wont stop. Pt has been having vomiting x 3 days and is almost out of Zofran. Zofran isn't helping the vomiting and pt is in a lot of pain due to the vomiting. Pt has also tried to take his Sodium tabs,  Pepo Bismol, drink water and Gatorade. Pt is asking for pain medication for the severe pain that he's in and if an emergency EGD can be done for the pain he is in. Please advise.

## 2018-04-02 NOTE — Telephone Encounter (Signed)
PATIENT CALLED BACK AND SAID HE IS GOING TO THE EMERGENCY ROOM

## 2018-04-02 NOTE — Discharge Instructions (Addendum)
You were evaluated in the emergency department for abdominal pain and vomiting.  You had multiple episodes of these symptoms before.  They are possibly related to some stomach ulcer or gastritis.  You should consider stopping drinking alcohol and we are putting you on some acid medication.  We will prescribe you a few days of some pain medicine to help bridge until the medicine for your stomach starts working.  You should let your primary care doctor know what is going on and reschedule an appointment with your GI doctor for discussion about an upper endoscopy.

## 2018-04-03 ENCOUNTER — Encounter (HOSPITAL_COMMUNITY): Payer: Self-pay | Admitting: Internal Medicine

## 2018-04-04 ENCOUNTER — Other Ambulatory Visit: Payer: Self-pay

## 2018-04-04 MED ORDER — PROMETHAZINE HCL 25 MG RE SUPP: RECTAL | 0 refills | Status: DC

## 2018-04-04 NOTE — Telephone Encounter (Signed)
Spoke with pt. Pt was able to rest some rest last night due to getting pain medication at the ED. Pt said he hasn't had an appetite and when he did eat a chicken sandwich with cheese and mayo, he was doubled over in pain even after taking the pain medication. Pt also was doubled over after drinking soda products. Pt was advised to avoid oily foods and carbonated drinks for right now. Pt wants to still discuss having an EGD and doesn't feel that his symptoms are related to the anesthesia. Pt stated that these are the same symptoms he's had several times with no changes. It seems that pt doesn't have much vomiting when he takes the pain medication but still get pains in his abdomen after eating food.

## 2018-04-08 NOTE — Telephone Encounter (Signed)
Patient scheduled for office visit with rmr beginning of November

## 2018-04-08 NOTE — Telephone Encounter (Signed)
Patient needs an office visit with extender

## 2018-04-08 NOTE — Telephone Encounter (Signed)
Please schedule ov with an extender per RMR.

## 2018-04-10 ENCOUNTER — Ambulatory Visit: Payer: Self-pay | Admitting: Physician Assistant

## 2018-04-10 ENCOUNTER — Encounter: Payer: Self-pay | Admitting: Physician Assistant

## 2018-04-10 VITALS — BP 123/82 | HR 91 | Temp 98.1°F | Ht 72.0 in | Wt 207.5 lb

## 2018-04-10 DIAGNOSIS — F17219 Nicotine dependence, cigarettes, with unspecified nicotine-induced disorders: Secondary | ICD-10-CM

## 2018-04-10 DIAGNOSIS — E785 Hyperlipidemia, unspecified: Secondary | ICD-10-CM

## 2018-04-10 DIAGNOSIS — D229 Melanocytic nevi, unspecified: Secondary | ICD-10-CM

## 2018-04-10 DIAGNOSIS — A630 Anogenital (venereal) warts: Secondary | ICD-10-CM

## 2018-04-10 NOTE — Patient Instructions (Addendum)
  [facility] [address] [place], [place] [address]  Phone: [phone]

## 2018-04-10 NOTE — Progress Notes (Signed)
BP 123/82 (BP Location: Right Arm, Patient Position: Sitting, Cuff Size: Normal)   Pulse 91   Temp 98.1 F (36.7 C)   Ht 6' (1.829 m)   Wt 207 lb 8 oz (94.1 kg)   SpO2 93%   BMI 28.14 kg/m    Subjective:    Patient ID: Ian Roy, male    DOB: 1971-02-21, 47 y.o.   MRN: [id number]  HPI: Ian Roy is a 47 y.o. male presenting on 04/10/2018 for Follow-up   HPI  Pt says no more emesis since Sunday.  He had colonoscopy recently has a follow up appointment with GI coming up in the near future  Pt has STILL not turned in rest of papers for [facility] [employer] [facility] application.   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  He says he had that in the past and he was treated by a dermatologist.     He is also concerned about a mole on his chest.   Pt also says he is hard of hearing lately.  He attributes it to playing drums in the past.  Discussed that we don't provide hearing tests but gave him name of local business that will.  Relevant past medical, surgical, family and social history reviewed and updated as indicated. Interim medical history since our last visit reviewed. Allergies and medications reviewed and updated.   Current Outpatient Medications:  .  Tetrahydrozoline HCl (VISINE OP), Place 1 drop into both eyes daily as needed (dry eyes)., Disp: , Rfl:    Review of Systems  Constitutional: Negative for appetite change, chills, diaphoresis, fatigue, fever and unexpected weight change.  HENT: Positive for hearing loss. Negative for congestion, dental problem, drooling, ear pain, facial swelling, mouth sores, sneezing, sore throat, trouble swallowing and voice change.   Eyes: Positive for visual disturbance. Negative for pain, discharge, redness and itching.  Respiratory: Positive for wheezing. Negative for cough, choking and shortness of breath.   Cardiovascular: Negative for chest pain, palpitations and leg swelling.  Gastrointestinal:  Negative for abdominal pain, blood in stool, constipation, diarrhea and vomiting.  Endocrine: Negative for cold intolerance, heat intolerance and polydipsia.  Genitourinary: Negative for decreased urine volume, dysuria and hematuria.  Musculoskeletal: Negative for arthralgias, back pain and gait problem.  Skin: Negative for rash.  Allergic/Immunologic: Negative for environmental allergies.  Neurological: Negative for seizures, syncope, light-headedness and headaches.  Hematological: Negative for adenopathy.  Psychiatric/Behavioral: Negative for agitation, dysphoric mood and suicidal ideas. The patient is not nervous/anxious.     Per HPI unless specifically indicated above     Objective:    BP 123/82 (BP Location: Right Arm, Patient Position: Sitting, Cuff Size: Normal)   Pulse 91   Temp 98.1 F (36.7 C)   Ht 6' (1.829 m)   Wt 207 lb 8 oz (94.1 kg)   SpO2 93%   BMI 28.14 kg/m   Wt Readings from Last 3 Encounters:  04/10/18 207 lb 8 oz (94.1 kg)  02/18/18 212 lb 9.6 oz (96.4 kg)  01/08/18 207 lb (93.9 kg)    Physical Exam  Constitutional: He is oriented to person, place, and time. He appears well-developed and well-nourished.  HENT:  Head: Normocephalic and atraumatic.  Neck: Neck supple.  Cardiovascular: Normal rate and regular rhythm.  Pulmonary/Chest: Effort normal and breath sounds normal. He has no wheezes.  Abdominal: Soft. Bowel sounds are normal. There is no hepatosplenomegaly. There is no tenderness.  Musculoskeletal: He exhibits  no edema.  Lymphadenopathy:    He has no cervical adenopathy.  Neurological: He is alert and oriented to person, place, and time.  Skin: Skin is warm and dry.  Mole on chest in L upper area is mildly pigmented and does not look worrisome.   Psychiatric: He has a normal mood and affect. His behavior is normal.  Vitals reviewed.       Assessment & Plan:   Encounter Diagnoses  Name Primary?  . Cigarette nicotine dependence with  nicotine-induced disorder Yes  . Hyperlipidemia, unspecified hyperlipidemia type   . Condyloma   . Nevus      -pt to Get fasting labs to check lipids tomorrow morning.  We will call him with his results -Pt again encouraged to turn in his [facility] care application -pt to Follow up with GI as scheduled -will Refer to dermatologist for genital warts and mole on chest -counseled smoking cessation -pt to follow up here 6 months.  RTO sooner prn

## 2018-04-12 ENCOUNTER — Other Ambulatory Visit (HOSPITAL_COMMUNITY)
Admission: AD | Admit: 2018-04-12 | Discharge: 2018-04-12 | Disposition: A | Discharge status: Home / Self Care | Payer: Self-pay | Source: Other Acute Inpatient Hospital | Attending: Physician Assistant | Admitting: Physician Assistant

## 2018-04-12 DIAGNOSIS — E785 Hyperlipidemia, unspecified: Secondary | ICD-10-CM | POA: Insufficient documentation

## 2018-04-12 LAB — LIPID PANEL
CHOLESTEROL: 177 mg/dL (ref 0–200)
HDL: 60 mg/dL (ref >40)
LDL Cholesterol: 81 mg/dL (ref 0–99)
Total CHOL/HDL Ratio: 3 RATIO
Triglycerides: 182 mg/dL — ABNORMAL HIGH (ref <150)
VLDL: 36 mg/dL (ref 0–40)

## 2018-04-12 LAB — COMPREHENSIVE METABOLIC PANEL
ALK PHOS: 59 U/L (ref 38–126)
ALT: 34 U/L (ref 0–44)
ANION GAP: 7 (ref 5–15)
AST: 24 U/L (ref 15–41)
Albumin: 4.3 g/dL (ref 3.5–5.0)
BILIRUBIN TOTAL: 0.8 mg/dL (ref 0.3–1.2)
BUN: 7 mg/dL (ref 6–20)
CALCIUM: 9.5 mg/dL (ref 8.9–10.3)
CO2: 29 mmol/L (ref 22–32)
Chloride: 99 mmol/L (ref 98–111)
Creatinine, Ser: 1 mg/dL (ref 0.61–1.24)
Glucose, Bld: 96 mg/dL (ref 70–99)
Potassium: 4.7 mmol/L (ref 3.5–5.1)
Sodium: 135 mmol/L (ref 135–145)
TOTAL PROTEIN: 7.5 g/dL (ref 6.5–8.1)

## 2018-05-17 ENCOUNTER — Ambulatory Visit: Payer: Self-pay | Admitting: Internal Medicine

## 2018-06-13 ENCOUNTER — Encounter: Payer: Self-pay | Admitting: Internal Medicine

## 2018-06-27 ENCOUNTER — Encounter (HOSPITAL_COMMUNITY): Payer: Self-pay | Admitting: Emergency Medicine

## 2018-06-27 ENCOUNTER — Emergency Department (HOSPITAL_COMMUNITY)
Admission: EM | Admit: 2018-06-27 | Discharge: 2018-06-27 | Disposition: A | Discharge status: Home / Self Care | Payer: Self-pay | Attending: Emergency Medicine | Admitting: Emergency Medicine

## 2018-06-27 ENCOUNTER — Emergency Department (HOSPITAL_COMMUNITY): Payer: Self-pay

## 2018-06-27 ENCOUNTER — Other Ambulatory Visit: Payer: Self-pay

## 2018-06-27 DIAGNOSIS — Z79899 Other long term (current) drug therapy: Secondary | ICD-10-CM | POA: Insufficient documentation

## 2018-06-27 DIAGNOSIS — F17210 Nicotine dependence, cigarettes, uncomplicated: Secondary | ICD-10-CM | POA: Insufficient documentation

## 2018-06-27 DIAGNOSIS — J40 Bronchitis, not specified as acute or chronic: Secondary | ICD-10-CM | POA: Insufficient documentation

## 2018-06-27 LAB — GROUP A STREP BY PCR: Group A Strep by PCR: NOT DETECTED

## 2018-06-27 MED ORDER — PREDNISONE 20 MG PO TABS: 40.0000 mg | ORAL_TABLET | Freq: Every day | ORAL | 0 refills | Status: DC

## 2018-06-27 MED ORDER — IPRATROPIUM-ALBUTEROL 0.5-2.5 (3) MG/3ML IN SOLN
3.0000 mL | Freq: Once | RESPIRATORY_TRACT | Status: AC
  Administered 2018-06-27: 3 mL via RESPIRATORY_TRACT
  Filled 2018-06-27: qty 3

## 2018-06-27 MED ORDER — BENZONATATE 100 MG PO CAPS
200.0000 mg | ORAL_CAPSULE | Freq: Once | ORAL | Status: AC
  Administered 2018-06-27: 200 mg via ORAL
  Filled 2018-06-27: qty 2

## 2018-06-27 MED ORDER — IBUPROFEN 800 MG PO TABS
800.0000 mg | ORAL_TABLET | Freq: Once | ORAL | Status: AC
  Administered 2018-06-27: 800 mg via ORAL
  Filled 2018-06-27: qty 1

## 2018-06-27 MED ORDER — ALBUTEROL SULFATE HFA 108 (90 BASE) MCG/ACT IN AERS
2.0000 | INHALATION_SPRAY | Freq: Once | RESPIRATORY_TRACT | Status: AC
  Administered 2018-06-27: 2 via RESPIRATORY_TRACT
  Filled 2018-06-27: qty 6.7

## 2018-06-27 MED ORDER — PREDNISONE 20 MG PO TABS
40.0000 mg | ORAL_TABLET | Freq: Once | ORAL | Status: AC
  Administered 2018-06-27: 40 mg via ORAL
  Filled 2018-06-27: qty 2

## 2018-06-27 MED ORDER — IBUPROFEN 800 MG PO TABS: 800.0000 mg | ORAL_TABLET | Freq: Three times a day (TID) | ORAL | 0 refills | Status: DC

## 2018-06-27 MED ORDER — GUAIFENESIN-CODEINE 100-10 MG/5ML PO SYRP: 10.0000 mL | ORAL_SOLUTION | Freq: Three times a day (TID) | ORAL | 0 refills | Status: DC | PRN

## 2018-06-27 MED ORDER — ACETAMINOPHEN 500 MG PO TABS
1000.0000 mg | ORAL_TABLET | Freq: Once | ORAL | Status: AC
  Administered 2018-06-27: 1000 mg via ORAL
  Filled 2018-06-27: qty 2

## 2018-06-27 MED ORDER — ALBUTEROL SULFATE (2.5 MG/3ML) 0.083% IN NEBU
2.5000 mg | INHALATION_SOLUTION | Freq: Once | RESPIRATORY_TRACT | Status: AC
  Administered 2018-06-27: 2.5 mg via RESPIRATORY_TRACT
  Filled 2018-06-27: qty 3

## 2018-06-27 NOTE — Discharge Instructions (Addendum)
1 to 2 puffs of the albuterol inhaler every 4-6 hours as needed while you are sick.  Drink plenty of fluids.  Start the prednisone prescription tomorrow.  Follow-up with your primary provider for recheck if needed.

## 2018-06-27 NOTE — ED Provider Notes (Signed)
[facility] EMERGENCY DEPARTMENT Provider Note   CSN: [id number] Arrival date & time: 06/27/18  1657     History   Chief Complaint Chief Complaint  Patient presents with  . Cough    HPI Ian Roy is a 47 y.o. male.  HPI   Ian Roy is a 47 y.o. male who presents to the Emergency Department complaining of sinus drainage, cough, sore throat, generalized body aches.  Symptoms have been present for 10 days.  He reports his cough is nonproductive and excessive, worse with lying down.  He denies known fever.  Is not been taking any medication for symptomatic relief.  He states he has coughed excessively and he believes this is what is making his throat hurt.  He also endorses wheezing while lying down.  Appetite has been diminished and he admits to decreased fluid intake.  He denies pain to his chest or shortness of breath.   Past Medical History:  Diagnosis Date  . Alcohol use    daily  . Bronchitis   . GERD (gastroesophageal reflux disease)     Patient Active Problem List   Diagnosis Date Noted  . Family history of colonic polyps 02/18/2018  . Elevated LFTs 03/15/2016  . Emesis 11/07/2015  . Esophageal reflux 11/07/2015  . Diarrhea 11/07/2015  . Alcohol abuse 11/02/2015  . Cigarette nicotine dependence without complication 27/74/1287    Past Surgical History:  Procedure Laterality Date  . colonoscopy    . COLONOSCOPY WITH PROPOFOL N/A 04/01/2018   Procedure: COLONOSCOPY WITH PROPOFOL;  Surgeon: Daneil Dolin, MD;  Location: [facility];  Service: Endoscopy;  Laterality: N/A;  9:15am  . None    . POLYPECTOMY  04/01/2018   Procedure: POLYPECTOMY;  Surgeon: Daneil Dolin, MD;  Location: [facility];  Service: Endoscopy;;      Home Medications    Prior to Admission medications   Medication Sig Start Date End Date Taking? Authorizing Provider  guaiFENesin-codeine (ROBITUSSIN AC) 100-10 MG/5ML syrup Take 10 mLs by mouth 3 (three) times daily as  needed. 06/27/18   Sahaj Bona, PA-C  ibuprofen (ADVIL,MOTRIN) 800 MG tablet Take 1 tablet (800 mg total) by mouth 3 (three) times daily. 06/27/18   Jaxx Huish, PA-C  predniSONE (DELTASONE) 20 MG tablet Take 2 tablets (40 mg total) by mouth daily. For 5 days 06/27/18   Emine Lopata, PA-C  Tetrahydrozoline HCl (VISINE OP) Place 1 drop into both eyes daily as needed (dry eyes).    [provider]    Family History Family History  Problem Relation Age of Onset  . Cancer Mother        breast  . Diabetes Mother   . Colon polyps Father   . Cancer Maternal Aunt   . Colon cancer Neg Hx     Social History Social History   Tobacco Use  . Smoking status: Current Every Day Smoker    Packs/day: 1.00    Years: 14.00    Pack years: 14.00    Types: Cigarettes  . Smokeless tobacco: Never Used  Substance Use Topics  . Alcohol use: Yes    Alcohol/week: 6.0 standard drinks    Types: 6 Cans of beer per week    Comment: Last ETOH 04/02/18  . Drug use: No     Allergies   Patient has no known allergies.   Review of Systems Review of Systems  Constitutional: Positive for appetite change. Negative for chills and fever.  HENT: Positive  for congestion, rhinorrhea, sinus pressure, sinus pain and sore throat. Negative for ear pain and trouble swallowing.   Respiratory: Positive for cough, chest tightness and wheezing. Negative for shortness of breath.   Cardiovascular: Negative for chest pain.  Gastrointestinal: Negative for abdominal pain, diarrhea, nausea and vomiting.  Genitourinary: Negative for dysuria and flank pain.  Musculoskeletal: Positive for myalgias. Negative for arthralgias.  Skin: Negative for rash.  Neurological: Negative for dizziness, weakness, numbness and headaches.  Hematological: Negative for adenopathy.     Physical Exam Updated Vital Signs BP 113/66 (BP Location: Left Arm)   Pulse (!) 103   Temp (!) 102.5 F (39.2 C) (Oral)   Resp 20   Ht 6'  (1.829 m)   Wt 95.3 kg   SpO2 96%   BMI 28.48 kg/m   Physical Exam Vitals signs and nursing note reviewed.  Constitutional:      Comments: Uncomfortable appearing, nontoxic  HENT:     Head: Atraumatic.     Right Ear: Tympanic membrane and ear canal normal.     Left Ear: Tympanic membrane and ear canal normal.     Nose: Congestion present.     Mouth/Throat:     Mouth: Mucous membranes are moist.     Pharynx: Oropharynx is clear. Posterior oropharyngeal erythema present. No oropharyngeal exudate.  Neck:     Musculoskeletal: Normal range of motion. No neck rigidity.  Cardiovascular:     Rate and Rhythm: Regular rhythm. Tachycardia present.     Pulses: Normal pulses.  Pulmonary:     Breath sounds: Wheezing present.     Comments: Lung sounds slightly diminished with expiratory wheezes on the left.  No rales. Abdominal:     General: There is no distension.     Palpations: Abdomen is soft.     Tenderness: There is no abdominal tenderness.  Musculoskeletal: Normal range of motion.        General: No swelling.  Skin:    General: Skin is warm.     Findings: No rash.  Neurological:     General: No focal deficit present.     Mental Status: He is alert.     Sensory: No sensory deficit.  Psychiatric:        Mood and Affect: Mood normal.      ED Treatments / Results  Labs (all labs ordered are listed, but only abnormal results are displayed) Labs Reviewed  GROUP A STREP BY PCR    EKG None  Radiology Dg Chest 2 View  Result Date: 06/27/2018 CLINICAL DATA:  47 y/o M; sinus drainage, cough, sore throat, body aches for 10 days. EXAM: CHEST - 2 VIEW COMPARISON:  01/08/2018 chest radiograph FINDINGS: Stable normal cardiac silhouette given projection and technique. Central bronchitic changes. No focal consolidation, effusion, or pneumothorax. No acute osseous abnormality is evident. IMPRESSION: Central bronchitic changes. No focal consolidation. Electronically Signed   By: Kristine Garbe M.D.   On: 06/27/2018 18:46    Procedures Procedures (including critical care time)  Medications Ordered in ED Medications  ipratropium-albuterol (DUONEB) 0.5-2.5 (3) MG/3ML nebulizer solution 3 mL (3 mLs Nebulization Given 06/27/18 1942)  albuterol (PROVENTIL) (2.5 MG/3ML) 0.083% nebulizer solution 2.5 mg (2.5 mg Nebulization Given 06/27/18 1942)  predniSONE (DELTASONE) tablet 40 mg (40 mg Oral Given 06/27/18 1906)  benzonatate (TESSALON) capsule 200 mg (200 mg Oral Given 06/27/18 1907)  albuterol (PROVENTIL HFA;VENTOLIN HFA) 108 (90 Base) MCG/ACT inhaler 2 puff (2 puffs Inhalation Given 06/27/18 1942)  ibuprofen (ADVIL,MOTRIN)  tablet 800 mg (800 mg Oral Given 06/27/18 1919)  acetaminophen (TYLENOL) tablet 1,000 mg (1,000 mg Oral Given 06/27/18 2023)     Initial Impression / Assessment and Plan / ED Course  I have reviewed the triage vital signs and the nursing notes.  Pertinent labs & imaging results that were available during my care of the patient were reviewed by me and considered in my medical decision making (see chart for details).     On recheck, vitals improved, but now has fever.  Tylenol and ibuprofen given.  Pt has been resting comfortably after albuterol neb and sleeping at time of dispo. Lung sounds improved.  No hypoxia or tachypnea.   Discussed XR findings.  He agrees to tx plan and appears appropriate for d/c home.  Return precautions discussed.   Final Clinical Impressions(s) / ED Diagnoses   Final diagnoses:  Bronchitis    ED Discharge Orders         Ordered    ibuprofen (ADVIL,MOTRIN) 800 MG tablet  3 times daily     06/27/18 2021    predniSONE (DELTASONE) 20 MG tablet  Daily     06/27/18 2021    guaiFENesin-codeine (ROBITUSSIN AC) 100-10 MG/5ML syrup  3 times daily PRN     06/27/18 2021           Kem Parkinson, PA-C 06/27/18 2036    Carmin Muskrat, MD 06/28/18 0004

## 2018-06-27 NOTE — ED Triage Notes (Signed)
Patient complains of sinus drainage, cough, sore throat, and body aches x 10 days.

## 2018-07-16 ENCOUNTER — Ambulatory Visit: Payer: Self-pay | Admitting: Internal Medicine

## 2018-07-16 ENCOUNTER — Encounter: Payer: Self-pay | Admitting: Physician Assistant

## 2018-07-16 ENCOUNTER — Ambulatory Visit: Payer: Self-pay | Admitting: Physician Assistant

## 2018-07-16 VITALS — BP 90/68 | HR 76 | Temp 97.5°F | Ht 72.0 in | Wt 201.0 lb

## 2018-07-16 DIAGNOSIS — F17200 Nicotine dependence, unspecified, uncomplicated: Secondary | ICD-10-CM

## 2018-07-16 DIAGNOSIS — J040 Acute laryngitis: Secondary | ICD-10-CM

## 2018-07-16 MED ORDER — LIDOCAINE VISCOUS HCL 2 % MT SOLN: OROMUCOSAL | 0 refills | Status: DC

## 2018-07-16 MED ORDER — ALBUTEROL SULFATE HFA 108 (90 BASE) MCG/ACT IN AERS: 2.0000 | INHALATION_SPRAY | Freq: Four times a day (QID) | RESPIRATORY_TRACT | 3 refills | Status: DC | PRN

## 2018-07-16 NOTE — Progress Notes (Signed)
BP 90/68 (BP Location: Left Arm, Patient Position: Sitting, Cuff Size: Normal)   Pulse 76   Temp (!) 97.5 F (36.4 C)   Ht 6' (1.829 m)   Wt 201 lb (91.2 kg)   SpO2 94%   BMI 27.26 kg/m    Subjective:    Patient ID: Ian Roy, male    DOB: 10-Nov-1970, 48 y.o.   MRN: [id number]  HPI: Ian Roy is a 48 y.o. male presenting on 07/16/2018 for Headache (cough, chest congestion, sore throat. hoarseness. sx began 06-15-18. pt has been using orajel for sore throat.)   HPI  Pt here for persistent laryngitis.  He also says he is still coughing.  Pt was seen in ER for same on 12/19.  Pt is still smoking.    Relevant past medical, surgical, family and social history reviewed and updated as indicated. Interim medical history since our last visit reviewed. Allergies and medications reviewed and updated.  Review of Systems  Constitutional: Positive for appetite change, diaphoresis and fatigue. Negative for chills, fever and unexpected weight change.  HENT: Positive for congestion, sore throat and voice change. Negative for dental problem, drooling, ear pain, facial swelling, hearing loss, mouth sores, sneezing and trouble swallowing.   Eyes: Negative for pain, discharge, redness, itching and visual disturbance.  Respiratory: Positive for cough and shortness of breath. Negative for choking and wheezing.   Cardiovascular: Negative for chest pain, palpitations and leg swelling.  Gastrointestinal: Negative for abdominal pain, blood in stool, constipation, diarrhea and vomiting.  Endocrine: Negative for cold intolerance, heat intolerance and polydipsia.  Genitourinary: Negative for decreased urine volume, dysuria and hematuria.  Musculoskeletal: Negative for arthralgias, back pain and gait problem.  Skin: Negative for rash.  Allergic/Immunologic: Negative for environmental allergies.  Neurological: Negative for seizures, syncope, light-headedness and headaches.  Hematological:  Negative for adenopathy.  Psychiatric/Behavioral: Negative for agitation, dysphoric mood and suicidal ideas. The patient is not nervous/anxious.     Per HPI unless specifically indicated above     Objective:    BP 90/68 (BP Location: Left Arm, Patient Position: Sitting, Cuff Size: Normal)   Pulse 76   Temp (!) 97.5 F (36.4 C)   Ht 6' (1.829 m)   Wt 201 lb (91.2 kg)   SpO2 94%   BMI 27.26 kg/m   Wt Readings from Last 3 Encounters:  07/16/18 201 lb (91.2 kg)  06/27/18 210 lb (95.3 kg)  04/10/18 207 lb 8 oz (94.1 kg)    Physical Exam Vitals signs reviewed.  Constitutional:      Appearance: He is well-developed.     Comments: During 35 minutes spent in the exam room with the pt, he did not cough at all, not even once.  HENT:     Head: Normocephalic and atraumatic.     Right Ear: Hearing, tympanic membrane, ear canal and external ear normal.     Left Ear: Hearing, tympanic membrane, ear canal and external ear normal.     Nose: Nose normal.     Mouth/Throat:     Mouth: Mucous membranes are moist. No oral lesions.     Dentition: No dental abscesses.     Pharynx: Oropharynx is clear. Uvula midline. No oropharyngeal exudate, posterior oropharyngeal erythema or uvula swelling.     Tonsils: No tonsillar exudate or tonsillar abscesses.     Comments: Pt voice is very mildly hoarse.  It is worsened when he forcefully whispers which is how he speaks majority of today's OV.  When he speaks normally, there is only mild hoarseness Neck:     Musculoskeletal: Neck supple.  Cardiovascular:     Rate and Rhythm: Normal rate and regular rhythm.  Pulmonary:     Effort: Pulmonary effort is normal.     Breath sounds: Normal breath sounds.  Lymphadenopathy:     Cervical: No cervical adenopathy.  Skin:    General: Skin is warm and dry.  Neurological:     Mental Status: He is alert and oriented to person, place, and time.  Psychiatric:        Behavior: Behavior normal.     Results for  orders placed or performed during the hospital encounter of 06/27/18  Group A Strep by PCR  Result Value Ref Range   Group A Strep by PCR NOT DETECTED NOT DETECTED      Assessment & Plan:    Encounter Diagnoses  Name Primary?  . Laryngitis Yes  . Tobacco use disorder      -Pt has been using tessalon and says it doesn't work.  He requests more narcotic cough medication.  This is declined.  Recommend he use OTC like delsym -counseled pt to avoid forceful whispering as it strains the vocal cords -counseled pt to avoid smoking as it is contributing to delayed healing and increased irritation of the vocal cords -pt is give rx for Viscous lidocaine.  He is counseled repeatedly to avoid swallowing this medication.  He is counseled to dilute small quantity in warm water, gargle and spit out -pt is to RTO if laryngitis persists.  At which time would consider referral to ENT for direct visualization -otherwise, pt to follow up as scheduled

## 2018-07-16 NOTE — Patient Instructions (Signed)
Laryngitis    Laryngitis is inflammation of the vocal cords that causes symptoms such as hoarseness or loss of voice. The vocal cords are two bands of muscles in your throat. When you speak, these cords come together and vibrate. The vibrations come out through your mouth as sound. When your vocal cords are inflamed, your voice sounds different.  Laryngitis can be temporary (acute) or long-term (chronic). Most cases of acute laryngitis improve with time. Chronic laryngitis is laryngitis that lasts for more than 3 weeks.  What are the causes?  Acute laryngitis may be caused by:   A viral infection.   Lots of talking, yelling, or singing. This is also called vocal strain.   A bacterial infection.  Chronic laryngitis may be caused by:   Vocal strain.   Injury to your vocal cords.   Acid reflux (gastroesophageal reflux disease, or GERD).   Allergies.   A sinus infection.   Smoking.   Alcohol abuse.   Breathing in chemicals or dust.   Growths on the vocal cords.  What increases the risk?  The following factors may make you more likely to develop this condition:   Smoking.   Alcohol abuse.   Having allergies.   Chronic irritants in the workplace, such as toxic fumes.  What are the signs or symptoms?  Symptoms of this condition may include:   Low, hoarse voice.   Loss of voice.   Dry cough.   Sore or dry throat.   Stuffy nose.  How is this diagnosed?  This condition may be diagnosed based on:   Your symptoms and a physical exam.   Throat culture.   Blood test.   A procedure in which your health care provider looks at your vocal cords with a mirror or viewing tube (laryngoscopy).  How is this treated?  Treatment for laryngitis depends on what is causing it.   Usually, treatment involves resting your voice and using medicines to soothe your throat.   If your laryngitis is caused by a bacterial infection, you may need to take antibiotic medicine.   If your laryngitis is caused by a growth, you may  need to have a procedure to remove it.  Follow these instructions at home:  Medicines   Take over-the-counter and prescription medicines only as told by your health care provider.   If you were prescribed an antibiotic medicine, take it as told by your health care provider. Do not stop taking the antibiotic even if you start to feel better.  General instructions   Talk as little as possible. Also avoid whispering, which can cause vocal strain.   Write instead of talking. Do this until your voice is back to normal.   Drink enough fluid to keep your urine pale yellow.   Breathe in moist air. Use a humidifier if you live in a dry climate.   Do not use any products that contain nicotine or tobacco, such as cigarettes and e-cigarettes. If you need help quitting, ask your health care provider.  Contact a health care provider if:   You have a fever.   You have increasing pain.   Your symptoms do not get better in 2 weeks.  Get help right away if:   You cough up blood.   You have difficulty swallowing.   You have trouble breathing.  Summary   Laryngitis is inflammation of the vocal cords that causes symptoms such as hoarseness or loss of voice.   Laryngitis can be temporary (  Chartered certified accountant Patient Education  [employer].

## 2018-07-22 ENCOUNTER — Encounter (HOSPITAL_COMMUNITY): Payer: Self-pay | Admitting: Emergency Medicine

## 2018-07-22 ENCOUNTER — Emergency Department (HOSPITAL_COMMUNITY)
Admission: EM | Admit: 2018-07-22 | Discharge: 2018-07-23 | Disposition: A | Discharge status: Home / Self Care | Payer: Self-pay | Attending: Emergency Medicine | Admitting: Emergency Medicine

## 2018-07-22 ENCOUNTER — Other Ambulatory Visit: Payer: Self-pay

## 2018-07-22 DIAGNOSIS — F17210 Nicotine dependence, cigarettes, uncomplicated: Secondary | ICD-10-CM | POA: Insufficient documentation

## 2018-07-22 DIAGNOSIS — Z79899 Other long term (current) drug therapy: Secondary | ICD-10-CM | POA: Insufficient documentation

## 2018-07-22 DIAGNOSIS — R112 Nausea with vomiting, unspecified: Secondary | ICD-10-CM | POA: Insufficient documentation

## 2018-07-22 DIAGNOSIS — R1013 Epigastric pain: Secondary | ICD-10-CM

## 2018-07-22 LAB — CBC
HCT: 48.4 % (ref 39.0–52.0)
Hemoglobin: 15.9 g/dL (ref 13.0–17.0)
MCH: 30.8 pg (ref 26.0–34.0)
MCHC: 32.9 g/dL (ref 30.0–36.0)
MCV: 93.8 fL (ref 80.0–100.0)
Platelets: 285 10*3/uL (ref 150–400)
RBC: 5.16 MIL/uL (ref 4.22–5.81)
RDW: 12.7 % (ref 11.5–15.5)
WBC: 8.2 10*3/uL (ref 4.0–10.5)
nRBC: 0 % (ref 0.0–0.2)

## 2018-07-22 LAB — COMPREHENSIVE METABOLIC PANEL
ALK PHOS: 43 U/L (ref 38–126)
ALT: 28 U/L (ref 0–44)
AST: 20 U/L (ref 15–41)
Albumin: 4.3 g/dL (ref 3.5–5.0)
Anion gap: 10 (ref 5–15)
BUN: 8 mg/dL (ref 6–20)
CO2: 26 mmol/L (ref 22–32)
Calcium: 9.7 mg/dL (ref 8.9–10.3)
Chloride: 99 mmol/L (ref 98–111)
Creatinine, Ser: 0.92 mg/dL (ref 0.61–1.24)
GFR calc Af Amer: >60 mL/min (ref >60)
GFR calc non Af Amer: >60 mL/min (ref >60)
Glucose, Bld: 103 mg/dL — ABNORMAL HIGH (ref 70–99)
Potassium: 3.4 mmol/L — ABNORMAL LOW (ref 3.5–5.1)
Sodium: 135 mmol/L (ref 135–145)
Total Bilirubin: 0.8 mg/dL (ref 0.3–1.2)
Total Protein: 7.9 g/dL (ref 6.5–8.1)

## 2018-07-22 LAB — LIPASE, BLOOD: Lipase: 20 U/L (ref 11–51)

## 2018-07-22 MED ORDER — FAMOTIDINE IN NACL 20-0.9 MG/50ML-% IV SOLN
20.0000 mg | Freq: Once | INTRAVENOUS | Status: AC
  Administered 2018-07-22: 20 mg via INTRAVENOUS
  Filled 2018-07-22: qty 50

## 2018-07-22 MED ORDER — LORAZEPAM 2 MG/ML IJ SOLN
1.0000 mg | Freq: Once | INTRAMUSCULAR | Status: AC
  Administered 2018-07-22: 1 mg via INTRAVENOUS
  Filled 2018-07-22: qty 1

## 2018-07-22 MED ORDER — DIPHENHYDRAMINE HCL 50 MG/ML IJ SOLN
50.0000 mg | Freq: Once | INTRAMUSCULAR | Status: AC
  Administered 2018-07-22: 50 mg via INTRAVENOUS
  Filled 2018-07-22: qty 1

## 2018-07-22 MED ORDER — METOCLOPRAMIDE HCL 5 MG/ML IJ SOLN
10.0000 mg | Freq: Once | INTRAMUSCULAR | Status: AC
  Administered 2018-07-22: 10 mg via INTRAVENOUS
  Filled 2018-07-22: qty 2

## 2018-07-22 NOTE — ED Triage Notes (Signed)
Pt C/O abdominal pain and vomiting that started 3 days ago. Pt states he was saw here for the same recently and was told it was ulcers.

## 2018-07-22 NOTE — ED Provider Notes (Signed)
[facility] EMERGENCY DEPARTMENT Provider Note   CSN: [id number] Arrival date & time: 07/22/18  2057     History   Chief Complaint Chief Complaint  Patient presents with  . Abdominal Pain    HPI Ian Roy is a 48 y.o. male.  The history is provided by the patient and a parent.  Abdominal Pain  Pain location:  LUQ Pain quality: sharp   Pain radiates to:  Does not radiate Pain severity:  Severe Onset quality:  Sudden Duration:  3 days Timing:  Intermittent Progression:  Worsening Chronicity:  Recurrent Relieved by:  Nothing Worsened by:  Nothing Associated symptoms: nausea and vomiting   Associated symptoms: no hematemesis   Patient with history of daily alcohol use, GERD presents for abdominal pain for about 3 days.  He reports has had multiple episodes in the past unclear etiology.  He has been told he should have an EGD. He reports associated nausea and vomiting.  He reports no one has been able to give him an answer for his pain  Past Medical History:  Diagnosis Date  . Alcohol use    daily  . Bronchitis   . GERD (gastroesophageal reflux disease)     Patient Active Problem List   Diagnosis Date Noted  . Family history of colonic polyps 02/18/2018  . Elevated LFTs 03/15/2016  . Emesis 11/07/2015  . Esophageal reflux 11/07/2015  . Diarrhea 11/07/2015  . Alcohol abuse 11/02/2015  . Cigarette nicotine dependence without complication 99/37/1696    Past Surgical History:  Procedure Laterality Date  . colonoscopy    . COLONOSCOPY WITH PROPOFOL N/A 04/01/2018   Procedure: COLONOSCOPY WITH PROPOFOL;  Surgeon: Daneil Dolin, MD;  Location: [facility];  Service: Endoscopy;  Laterality: N/A;  9:15am  . None    . POLYPECTOMY  04/01/2018   Procedure: POLYPECTOMY;  Surgeon: Daneil Dolin, MD;  Location: [facility];  Service: Endoscopy;;        Home Medications    Prior to Admission medications   Medication Sig Start Date End Date Taking?  Authorizing Provider  albuterol (PROVENTIL HFA;VENTOLIN HFA) 108 (90 Base) MCG/ACT inhaler Inhale 2 puffs into the lungs every 6 (six) hours as needed for wheezing or shortness of breath. 07/16/18   Soyla Dryer, PA-C  guaiFENesin-codeine (ROBITUSSIN AC) 100-10 MG/5ML syrup Take 10 mLs by mouth 3 (three) times daily as needed. Patient not taking: Reported on 07/16/2018 06/27/18   Kem Parkinson, PA-C  ibuprofen (ADVIL,MOTRIN) 800 MG tablet Take 1 tablet (800 mg total) by mouth 3 (three) times daily. 06/27/18   Triplett, Tammy, PA-C  lidocaine (XYLOCAINE) 2 % solution 5 cc Gargle and spit q 4-6 hour prn 07/16/18   Soyla Dryer, PA-C  predniSONE (DELTASONE) 20 MG tablet Take 2 tablets (40 mg total) by mouth daily. For 5 days Patient not taking: Reported on 07/16/2018 06/27/18   Triplett, Lynelle Smoke, PA-C  Tetrahydrozoline HCl (VISINE OP) Place 1 drop into both eyes daily as needed (dry eyes).    [provider]    Family History Family History  Problem Relation Age of Onset  . Cancer Mother        breast  . Diabetes Mother   . Colon polyps Father   . Cancer Maternal Aunt   . Colon cancer Neg Hx     Social History Social History   Tobacco Use  . Smoking status: Current Every Day Smoker    Packs/day: 1.00    Years: 14.00  Pack years: 14.00    Types: Cigarettes  . Smokeless tobacco: Never Used  Substance Use Topics  . Alcohol use: Yes    Alcohol/week: 6.0 standard drinks    Types: 6 Cans of beer per week    Comment: Last ETOH 04/02/18  . Drug use: No     Allergies   Patient has no known allergies.   Review of Systems Review of Systems  Gastrointestinal: Positive for abdominal pain, nausea and vomiting. Negative for hematemesis.  Psychiatric/Behavioral: The patient is nervous/anxious.   All other systems reviewed and are negative.    Physical Exam Updated Vital Signs BP (!) 124/100 (BP Location: Right Arm)   Pulse 85   Temp 98 F (36.7 C) (Oral)   Resp 17    SpO2 100%   Physical Exam CONSTITUTIONAL: Anxious, on the floor on his knees looking towards the floor dry heaving HEAD: Normocephalic/atraumatic EYES: EOMI/PERRL, no icterus ENMT: Mucous membranes moist NECK: supple no meningeal signs SPINE/BACK:entire spine nontender CV: S1/S2 noted, no murmurs/rubs/gallops noted LUNGS: Lungs are clear to auscultation bilaterally, no apparent distress ABDOMEN: soft, nontender, no rebound or guarding, bowel sounds noted throughout abdomen, no hernia, no focal tenderness NEURO: Pt is awake/alert/appropriate, moves all extremitiesx4.  No facial droop.   EXTREMITIES: pulses normal/equal, full ROM SKIN: warm, color normal PSYCH: Anxious and hyperventilating   ED Treatments / Results  Labs (all labs ordered are listed, but only abnormal results are displayed) Labs Reviewed  COMPREHENSIVE METABOLIC PANEL - Abnormal; Notable for the following components:      Result Value   Potassium 3.4 (*)    Glucose, Bld 103 (*)    All other components within normal limits  LIPASE, BLOOD  CBC    EKG EKG Interpretation  Date/Time:  Monday July 22 2018 23:51:33 EST Ventricular Rate:  57 PR Interval:    QRS Duration: 96 QT Interval:  421 QTC Calculation: 410 R Axis:   69 Text Interpretation:  Sinus rhythm No previous ECGs available Confirmed by Ripley Fraise [phone]) on 07/23/2018 12:18:46 AM   Radiology No results found.  Procedures Procedures    Medications Ordered in ED Medications  metoCLOPramide (REGLAN) injection 10 mg (10 mg Intravenous Given 07/22/18 2345)  diphenhydrAMINE (BENADRYL) injection 50 mg (50 mg Intravenous Given 07/22/18 2345)  LORazepam (ATIVAN) injection 1 mg (1 mg Intravenous Given 07/22/18 2344)  famotidine (PEPCID) IVPB 20 mg premix (0 mg Intravenous Stopped 07/23/18 0016)     Initial Impression / Assessment and Plan / ED Course  I have reviewed the triage vital signs and the nursing notes.  Pertinent labs results that  were available during my care of the patient were reviewed by me and considered in my medical decision making (see chart for details).     11:38 PM Presents for recurrence of abdominal pain.  He reports he gets this frequently and is unclear what causes it.  He has had multiple evaluations in the past Recent CT was from July 2019 and it was negative He has had colonoscopy in the recent past but no EGD Tonight he reports the pain is similar to prior episodes.  He is very argumentative during the exam, appears very anxious and dry heaving sitting on the floor.  He initially points to his mother to provide all history. Abdominal exam is unremarkable. Treat his pain and reassess.  Labs are reassuring.  I do not feel further imaging is required.  He will need close follow-up with GI 12:49 AM He is  improved, resting comfortably.  He is taking p.o. fluids. He already has f/u with GI  No signs of acute abdominal emergency  Final Clinical Impressions(s) / ED Diagnoses   Final diagnoses:  Non-intractable vomiting with nausea, unspecified vomiting type  Epigastric pain    ED Discharge Orders         Ordered    metoCLOPramide (REGLAN) 10 MG tablet  Every 6 hours PRN     07/23/18 0010           Ripley Fraise, MD 07/23/18 [phone]

## 2018-07-23 MED ORDER — METOCLOPRAMIDE HCL 10 MG PO TABS: 10.0000 mg | ORAL_TABLET | Freq: Four times a day (QID) | ORAL | 0 refills | Status: DC | PRN

## 2018-07-23 NOTE — Discharge Instructions (Addendum)

## 2018-07-26 ENCOUNTER — Ambulatory Visit (INDEPENDENT_AMBULATORY_CARE_PROVIDER_SITE_OTHER): Payer: Self-pay | Admitting: Internal Medicine

## 2018-07-26 ENCOUNTER — Telehealth: Payer: Self-pay | Admitting: *Deleted

## 2018-07-26 ENCOUNTER — Encounter: Payer: Self-pay | Admitting: *Deleted

## 2018-07-26 ENCOUNTER — Other Ambulatory Visit: Payer: Self-pay | Admitting: *Deleted

## 2018-07-26 ENCOUNTER — Encounter: Payer: Self-pay | Admitting: Internal Medicine

## 2018-07-26 ENCOUNTER — Telehealth: Payer: Self-pay | Admitting: Internal Medicine

## 2018-07-26 VITALS — BP 111/67 | HR 88 | Temp 97.7°F | Ht 72.0 in | Wt 198.0 lb

## 2018-07-26 DIAGNOSIS — K219 Gastro-esophageal reflux disease without esophagitis: Secondary | ICD-10-CM

## 2018-07-26 DIAGNOSIS — R1013 Epigastric pain: Secondary | ICD-10-CM

## 2018-07-26 NOTE — Progress Notes (Signed)
duplicate

## 2018-07-26 NOTE — Telephone Encounter (Signed)
Pre-op scheduled for 08/14/2018 at 2:45pm. LMOVM. Letter mailed.

## 2018-07-26 NOTE — Patient Instructions (Signed)
Schedule an EGD - diagnostic - GERD and dyspepsia - Propofol  Further recommendations to follow

## 2018-07-26 NOTE — Progress Notes (Signed)
Primary Care Physician:  Soyla Dryer, PA-C Primary Gastroenterologist:  Dr.   Pre-Procedure History & Physical: HPI:  Ian Roy is a 48 y.o. male here for worsening dyspeptic symptoms intermittent nausea, vomiting ; no melena or rectal bleeding.  Vague upper abdominal pain.  Longstanding heavy alcohol use.  Currently drinks 4-6 beers nightly.  No dysphagia.  No acid suppression therapy URI issues over the past several weeks.  Seen in the ED.  Taking increased dose of NSAIDs recently. Recently incarcerated for a year secondary to DWI.  Colonoscopy last year demonstrated multiple colonic adenomas-removed;  5-year surveillance recommended;  no prior EGD.  No evidence of chronic liver disease based on cross-sectional imaging and labs  Past Medical History:  Diagnosis Date  . Alcohol use    daily  . Bronchitis   . GERD (gastroesophageal reflux disease)     Past Surgical History:  Procedure Laterality Date  . colonoscopy    . COLONOSCOPY WITH PROPOFOL N/A 04/01/2018   Procedure: COLONOSCOPY WITH PROPOFOL;  Surgeon: Daneil Dolin, MD;  Location: [facility];  Service: Endoscopy;  Laterality: N/A;  9:15am  . None    . POLYPECTOMY  04/01/2018   Procedure: POLYPECTOMY;  Surgeon: Daneil Dolin, MD;  Location: [facility];  Service: Endoscopy;;    Prior to Admission medications   Medication Sig Start Date End Date Taking? Authorizing Provider  metoCLOPramide (REGLAN) 10 MG tablet Take 1 tablet (10 mg total) by mouth every 6 (six) hours as needed for nausea (nausea/headache). 07/23/18  Yes Ripley Fraise, MD  albuterol (PROVENTIL HFA;VENTOLIN HFA) 108 (90 Base) MCG/ACT inhaler Inhale 2 puffs into the lungs every 6 (six) hours as needed for wheezing or shortness of breath. Patient not taking: Reported on 07/26/2018 07/16/18   Soyla Dryer, PA-C  guaiFENesin-codeine (ROBITUSSIN AC) 100-10 MG/5ML syrup Take 10 mLs by mouth 3 (three) times daily as needed. Patient not taking:  Reported on 07/16/2018 06/27/18   Kem Parkinson, PA-C  ibuprofen (ADVIL,MOTRIN) 800 MG tablet Take 1 tablet (800 mg total) by mouth 3 (three) times daily. Patient not taking: Reported on 07/26/2018 06/27/18   Kem Parkinson, PA-C  lidocaine (XYLOCAINE) 2 % solution 5 cc Gargle and spit q 4-6 hour prn Patient not taking: Reported on 07/26/2018 07/16/18   Soyla Dryer, PA-C  predniSONE (DELTASONE) 20 MG tablet Take 2 tablets (40 mg total) by mouth daily. For 5 days Patient not taking: Reported on 07/16/2018 06/27/18   Triplett, Lynelle Smoke, PA-C  Tetrahydrozoline HCl (VISINE OP) Place 1 drop into both eyes daily as needed (dry eyes).    [provider]    Allergies as of 07/26/2018  . (No Known Allergies)    Family History  Problem Relation Age of Onset  . Cancer Mother        breast  . Diabetes Mother   . Colon polyps Father   . Cancer Maternal Aunt   . Colon cancer Neg Hx     Social History   Socioeconomic History  . Marital status: Divorced    Spouse name: Not on file  . Number of children: Not on file  . Years of education: Not on file  . Highest education level: Not on file  Occupational History  . Occupation: CNA    Comment: prn  . Occupation: Animator  Social Needs  . Financial resource strain: Not on file  . Food insecurity:    Worry: Not on file    Inability: Not on  file  . Transportation needs:    Medical: Not on file    Non-medical: Not on file  Tobacco Use  . Smoking status: Current Every Day Smoker    Packs/day: 1.00    Years: 14.00    Pack years: 14.00    Types: Cigarettes  . Smokeless tobacco: Never Used  Substance and Sexual Activity  . Alcohol use: Yes    Alcohol/week: 6.0 standard drinks    Types: 6 Cans of beer per week    Comment: "I've cut back"  . Drug use: No  . Sexual activity: Not on file  Lifestyle  . Physical activity:    Days per week: Not on file    Minutes per session: Not on file  . Stress: Not on file  Relationships  .  Social connections:    Talks on phone: Not on file    Gets together: Not on file    Attends religious service: Not on file    Active member of club or organization: Not on file    Attends meetings of clubs or organizations: Not on file    Relationship status: Not on file  . Intimate partner violence:    Fear of current or ex partner: Not on file    Emotionally abused: Not on file    Physically abused: Not on file    Forced sexual activity: Not on file  Other Topics Concern  . Not on file  Social History Narrative  . Not on file    Review of Systems: See HPI, otherwise negative ROS  Physical Exam: BP 111/67   Pulse 88   Temp 97.7 F (36.5 C) (Oral)   Ht 6' (1.829 m)   Wt 198 lb (89.8 kg)   BMI 26.85 kg/m  General:   Alert,   pleasant and cooperative in NAD.  Malar telangiectasias present Neck:  Supple; no masses or thyromegaly. No significant cervical adenopathy. Lungs:  Clear throughout to auscultation.   No wheezes, crackles, or rhonchi. No acute distress. Heart:  Regular rate and rhythm; no murmurs, clicks, rubs,  or gallops. Abdomen: Non-distended, normal bowel sounds.  Soft and nontender without appreciable mass or hepatosplenomegaly.  Pulses:  Normal pulses noted. Extremities:  Without clubbing or edema.  Impression 48 year old with longstanding alcohol and tobacco use presents with significant GERD and ibuprofen use in the setting of tobacco and alcohol.  Differential is broad -  EtOH gastropathy, peptic ulcer disease, reflux esophagitis all in the mix.  I doubt pancreatitis.  Needs an EGD  Recommendations:  Diagnostic EGD in the near future-propofol.The risks, benefits, limitations, alternatives and imponderables have been reviewed with the patient. Potential for esophageal dilation, biopsy, etc. have also been reviewed.  Questions have been answered. All parties agreeable. Patient needs to stop drinking.  Continued alcohol use will likely shorten his life as  discussed with he and his mother today. Avoid nonsteroidal agents.  Further recommendations to follow.   Notice: This dictation was prepared with Dragon dictation along with smaller phrase technology. Any transcriptional errors that result from this process are unintentional and may not be corrected upon review.

## 2018-07-26 NOTE — Telephone Encounter (Signed)
If a patient needs a work note for today can we fax it to the employer or do they have to pick it up?

## 2018-07-26 NOTE — Telephone Encounter (Signed)
We can fax it to the employer if the patient wants [place] to.

## 2018-08-13 ENCOUNTER — Encounter (HOSPITAL_COMMUNITY)
Admission: RE | Admit: 2018-08-13 | Discharge: 2018-08-13 | Disposition: A | Discharge status: Home / Self Care | Payer: Self-pay | Source: Ambulatory Visit | Attending: Internal Medicine | Admitting: Internal Medicine

## 2018-08-13 NOTE — Patient Instructions (Signed)
Ian Roy  08/13/2018     @PREFPERIOPPHARMACY @   Your procedure is scheduled on  08/19/2018  Report to [facility] at  1015 A.M.  Call this number if you have problems the morning of surgery:  [phone]   Remember:  Follow the diet instructions given to you by Dr Roseanne Kaufman office.                      Take these medicines the morning of surgery with A SIP OF WATER  Reglan.    Do not wear jewelry, make-up or nail polish.  Do not wear lotions, powders, or perfumes, or deodorant.  Do not shave 48 hours prior to surgery.  Men may shave face and neck.  Do not bring valuables to the hospital.  [facility] is not responsible for any belongings or valuables.  Contacts, dentures or bridgework may not be worn into surgery.  Leave your suitcase in the car.  After surgery it may be brought to your room.  For patients admitted to the hospital, discharge time will be determined by your treatment team.  Patients discharged the day of surgery will not be allowed to drive home.   Name and phone number of your driver:   family Special instructions:  None  Please read over the following fact sheets that you were given. Anesthesia Post-op Instructions and Care and Recovery After Surgery       Upper Endoscopy, Adult Upper endoscopy is a procedure to look inside the upper GI (gastrointestinal) tract. The upper GI tract is made up of:  The part of the body that moves food from your mouth to your stomach (esophagus).  The stomach.  The first part of your small intestine (duodenum). This procedure is also called esophagogastroduodenoscopy (EGD) or gastroscopy. In this procedure, your health care provider passes a thin, flexible tube (endoscope) through your mouth and down your esophagus into your stomach. A small camera is attached to the end of the tube. Images from the camera appear on a monitor in the exam room. During this procedure, your health care provider may also  remove a small piece of tissue to be sent to a lab and examined under a microscope (biopsy). Your health care provider may do an upper endoscopy to diagnose cancers of the upper GI tract. You may also have this procedure to find the cause of other conditions, such as:  Stomach pain.  Heartburn.  Pain or problems when swallowing.  Nausea and vomiting.  Stomach bleeding.  Stomach ulcers. Tell a health care provider about:  Any allergies you have.  All medicines you are taking, including vitamins, herbs, eye drops, creams, and over-the-counter medicines.  Any problems you or family members have had with anesthetic medicines.  Any blood disorders you have.  Any surgeries you have had.  Any medical conditions you have.  Whether you are pregnant or may be pregnant. What are the risks? Generally, this is a safe procedure. However, problems may occur, including:  Infection.  Bleeding.  Allergic reactions to medicines.  A tear or hole (perforation) in the esophagus, stomach, or duodenum. What happens before the procedure? Staying hydrated Follow instructions from your health care provider about hydration, which may include:  Up to 2 hours before the procedure - you may continue to drink clear liquids, such as water, clear fruit juice, black coffee, and plain tea.  Eating and drinking restrictions Follow  instructions from your health care provider about eating and drinking, which may include:  8 hours before the procedure - stop eating heavy meals or foods, such as meat, fried foods, or fatty foods.  6 hours before the procedure - stop eating light meals or foods, such as toast or cereal.  6 hours before the procedure - stop drinking milk or drinks that contain milk.  2 hours before the procedure - stop drinking clear liquids. Medicines Ask your health care provider about:  Changing or stopping your regular medicines. This is especially important if you are taking  diabetes medicines or blood thinners.  Taking medicines such as aspirin and ibuprofen. These medicines can thin your blood. Do not take these medicines unless your health care provider tells you to take them.  Taking over-the-counter medicines, vitamins, herbs, and supplements. General instructions  Plan to have someone take you home from the hospital or clinic.  If you will be going home right after the procedure, plan to have someone with you for 24 hours.  Ask your health care provider what steps will be taken to help prevent infection. What happens during the procedure?   An IV will be inserted into one of your veins.  You may be given one or more of the following: ? A medicine to help you relax (sedative). ? A medicine to numb the throat (local anesthetic).  You will lie on your left side on an exam table.  Your health care provider will pass the endoscope through your mouth and down your esophagus.  Your health care provider will use the scope to check the inside of your esophagus, stomach, and duodenum. Biopsies may be taken.  The endoscope will be removed. The procedure may vary among health care providers and hospitals. What happens after the procedure?  Your blood pressure, heart rate, breathing rate, and blood oxygen level will be monitored until you leave the hospital or clinic.  Do not drive for 24 hours if you were given a sedative during your procedure.  When your throat is no longer numb, you may be given some fluids to drink.  It is up to you to get the results of your procedure. Ask your health care provider, or the department that is doing the procedure, when your results will be ready. Summary  Upper endoscopy is a procedure to look inside the upper GI tract.  During the procedure, an IV will be inserted into one of your veins. You may be given a medicine to help you relax.  A medicine will be used to numb your throat.  The endoscope will be passed  through your mouth and down your esophagus. This information is not intended to replace advice given to you by your health care provider. Make sure you discuss any questions you have with your health care provider. Document Released: 06/23/2000 Document Revised: 11/26/2017 Document Reviewed: 11/26/2017 Elsevier Interactive Patient Education  2019 [place] [facility], Adult, Care After This sheet gives you information about how to care for yourself after your procedure. Your health care provider may also give you more specific instructions. If you have problems or questions, contact your health care provider. What can I expect after the procedure? After the procedure, it is common to have:  A sore throat.  Mild stomach pain or discomfort.  Bloating.  Nausea. Follow these instructions at home:   Follow instructions from your health care provider about what to eat or drink after your procedure.  Return  to your normal activities as told by your health care provider. Ask your health care provider what activities are safe for you.  Take over-the-counter and prescription medicines only as told by your health care provider.  Do not drive for 24 hours if you were given a sedative during your procedure.  Keep all follow-up visits as told by your health care provider. This is important. Contact a health care provider if you have:  A sore throat that lasts longer than one day.  Trouble swallowing. Get help right away if:  You vomit blood or your vomit looks like coffee grounds.  You have: ? A fever. ? Bloody, black, or tarry stools. ? A severe sore throat or you cannot swallow. ? Difficulty breathing. ? Severe pain in your chest or abdomen. Summary  After the procedure, it is common to have a sore throat, mild stomach discomfort, bloating, and nausea.  Do not drive for 24 hours if you were given a sedative during the procedure.  Follow instructions from your health  care provider about what to eat or drink after your procedure.  Return to your normal activities as told by your health care provider. This information is not intended to replace advice given to you by your health care provider. Make sure you discuss any questions you have with your health care provider. Document Released: 12/26/2011 Document Revised: 11/26/2017 Document Reviewed: 11/26/2017 Elsevier Interactive Patient Education  2019 [place] Anesthesia is a term that refers to techniques, procedures, and medicines that help a person stay safe and comfortable during a medical procedure. Monitored anesthesia care, or sedation, is one type of anesthesia. Your anesthesia specialist may recommend sedation if you will be having a procedure that does not require you to be unconscious, such as:  Cataract surgery.  A dental procedure.  A biopsy.  A colonoscopy. During the procedure, you may receive a medicine to help you relax (sedative). There are three levels of sedation:  Mild sedation. At this level, you may feel awake and relaxed. You will be able to follow directions.  Moderate sedation. At this level, you will be sleepy. You may not remember the procedure.  Deep sedation. At this level, you will be asleep. You will not remember the procedure. The more medicine you are given, the deeper your level of sedation will be. Depending on how you respond to the procedure, the anesthesia specialist may change your level of sedation or the type of anesthesia to fit your needs. An anesthesia specialist will monitor you closely during the procedure. Let your health care provider know about:  Any allergies you have.  All medicines you are taking, including vitamins, herbs, eye drops, creams, and over-the-counter medicines.  Any use of steroids (by mouth or as a cream).  Any problems you or family members have had with sedatives and anesthetic medicines.  Any  blood disorders you have.  Any surgeries you have had.  Any medical conditions you have, such as sleep apnea.  Whether you are pregnant or may be pregnant.  Any use of cigarettes, alcohol, or street drugs. What are the risks? Generally, this is a safe procedure. However, problems may occur, including:  Getting too much medicine (oversedation).  Nausea.  Allergic reaction to medicines.  Trouble breathing. If this happens, a breathing tube may be used to help with breathing. It will be removed when you are awake and breathing on your own.  Heart trouble.  Lung trouble. Before the procedure  Staying hydrated Follow instructions from your health care provider about hydration, which may include:  Up to 2 hours before the procedure - you may continue to drink clear liquids, such as water, clear fruit juice, black coffee, and plain tea. Eating and drinking restrictions Follow instructions from your health care provider about eating and drinking, which may include:  8 hours before the procedure - stop eating heavy meals or foods such as meat, fried foods, or fatty foods.  6 hours before the procedure - stop eating light meals or foods, such as toast or cereal.  6 hours before the procedure - stop drinking milk or drinks that contain milk.  2 hours before the procedure - stop drinking clear liquids. Medicines Ask your health care provider about:  Changing or stopping your regular medicines. This is especially important if you are taking diabetes medicines or blood thinners.  Taking medicines such as aspirin and ibuprofen. These medicines can thin your blood. Do not take these medicines before your procedure if your health care provider instructs you not to. Tests and exams  You will have a physical exam.  You may have blood tests done to show: ? How well your kidneys and liver are working. ? How well your blood can clot. General instructions  Plan to have someone take you  home from the hospital or clinic.  If you will be going home right after the procedure, plan to have someone with you for 24 hours.  What happens during the procedure?  Your blood pressure, heart rate, breathing, level of pain and overall condition will be monitored.  An IV tube will be inserted into one of your veins.  Your anesthesia specialist will give you medicines as needed to keep you comfortable during the procedure. This may mean changing the level of sedation.  The procedure will be performed. After the procedure  Your blood pressure, heart rate, breathing rate, and blood oxygen level will be monitored until the medicines you were given have worn off.  Do not drive for 24 hours if you received a sedative.  You may: ? Feel sleepy, clumsy, or nauseous. ? Feel forgetful about what happened after the procedure. ? Have a sore throat if you had a breathing tube during the procedure. ? Vomit. This information is not intended to replace advice given to you by your health care provider. Make sure you discuss any questions you have with your health care provider. Document Released: 03/22/2005 Document Revised: 12/03/2015 Document Reviewed: 10/17/2015 Elsevier Interactive Patient Education  2019 [place], Care After These instructions provide you with information about caring for yourself after your procedure. Your health care provider may also give you more specific instructions. Your treatment has been planned according to current medical practices, but problems sometimes occur. Call your health care provider if you have any problems or questions after your procedure. What can I expect after the procedure? After your procedure, you may:  Feel sleepy for several hours.  Feel clumsy and have poor balance for several hours.  Feel forgetful about what happened after the procedure.  Have poor judgment for several hours.  Feel nauseous or  vomit.  Have a sore throat if you had a breathing tube during the procedure. Follow these instructions at home: For at least 24 hours after the procedure:      Have a responsible adult stay with you. It is important to have someone help care for you until you are awake and alert.  Rest as needed.  Do not: ? Participate in activities in which you could fall or become injured. ? Drive. ? Use heavy machinery. ? Drink alcohol. ? Take sleeping pills or medicines that cause drowsiness. ? Make important decisions or sign legal documents. ? Take care of children on your own. Eating and drinking  Follow the diet that is recommended by your health care provider.  If you vomit, drink water, juice, or soup when you can drink without vomiting.  Make sure you have little or no nausea before eating solid foods. General instructions  Take over-the-counter and prescription medicines only as told by your health care provider.  If you have sleep apnea, surgery and certain medicines can increase your risk for breathing problems. Follow instructions from your health care provider about wearing your sleep device: ? Anytime you are sleeping, including during daytime naps. ? While taking prescription pain medicines, sleeping medicines, or medicines that make you drowsy.  If you smoke, do not smoke without supervision.  Keep all follow-up visits as told by your health care provider. This is important. Contact a health care provider if:  You keep feeling nauseous or you keep vomiting.  You feel light-headed.  You develop a rash.  You have a fever. Get help right away if:  You have trouble breathing. Summary  For several hours after your procedure, you may feel sleepy and have poor judgment.  Have a responsible adult stay with you for at least 24 hours or until you are awake and alert. This information is not intended to replace advice given to you by your health care provider. Make sure  you discuss any questions you have with your health care provider. Document Released: 10/17/2015 Document Revised: 02/09/2017 Document Reviewed: 10/17/2015 Elsevier Interactive Patient Education  2019 [employer].

## 2018-08-14 ENCOUNTER — Inpatient Hospital Stay (HOSPITAL_COMMUNITY): Admission: RE | Admit: 2018-08-14 | Payer: Self-pay | Source: Ambulatory Visit

## 2018-08-19 ENCOUNTER — Telehealth: Payer: Self-pay | Admitting: Internal Medicine

## 2018-08-19 ENCOUNTER — Ambulatory Visit (HOSPITAL_COMMUNITY): Payer: Self-pay | Admitting: Anesthesiology

## 2018-08-19 ENCOUNTER — Encounter: Payer: Self-pay | Admitting: General Practice

## 2018-08-19 ENCOUNTER — Encounter (HOSPITAL_COMMUNITY): Payer: Self-pay | Admitting: *Deleted

## 2018-08-19 ENCOUNTER — Ambulatory Visit (HOSPITAL_COMMUNITY)
Admission: RE | Admit: 2018-08-19 | Discharge: 2018-08-19 | Disposition: A | Discharge status: Home / Self Care | Payer: Self-pay | Source: Ambulatory Visit | Attending: Internal Medicine | Admitting: Internal Medicine

## 2018-08-19 ENCOUNTER — Encounter (HOSPITAL_COMMUNITY)
Admission: RE | Disposition: A | Discharge status: Home / Self Care | Payer: Self-pay | Source: Ambulatory Visit | Attending: Internal Medicine

## 2018-08-19 DIAGNOSIS — F10129 Alcohol abuse with intoxication, unspecified: Secondary | ICD-10-CM | POA: Insufficient documentation

## 2018-08-19 DIAGNOSIS — Z5309 Procedure and treatment not carried out because of other contraindication: Secondary | ICD-10-CM | POA: Insufficient documentation

## 2018-08-19 DIAGNOSIS — K219 Gastro-esophageal reflux disease without esophagitis: Secondary | ICD-10-CM | POA: Insufficient documentation

## 2018-08-19 DIAGNOSIS — Y906 Blood alcohol level of 120-199 mg/100 ml: Secondary | ICD-10-CM | POA: Insufficient documentation

## 2018-08-19 DIAGNOSIS — R1013 Epigastric pain: Secondary | ICD-10-CM

## 2018-08-19 LAB — ETHANOL: Alcohol, Ethyl (B): 194 mg/dL — ABNORMAL HIGH (ref <10)

## 2018-08-19 SURGERY — ESOPHAGOGASTRODUODENOSCOPY (EGD) WITH PROPOFOL
Anesthesia: Monitor Anesthesia Care

## 2018-08-19 MED ORDER — CHLORHEXIDINE GLUCONATE CLOTH 2 % EX PADS: 6.0000 | MEDICATED_PAD | Freq: Once | CUTANEOUS | Status: DC

## 2018-08-19 MED ORDER — LACTATED RINGERS IV SOLN
INTRAVENOUS | Status: DC
  Administered 2018-08-19: 1000 mL via INTRAVENOUS

## 2018-08-19 NOTE — Addendum Note (Signed)
Addendum  created 08/19/18 1314 by Vena Rua, MD   Intraprocedure Event edited

## 2018-08-19 NOTE — Anesthesia Preprocedure Evaluation (Signed)
Anesthesia Evaluation    Airway Mallampati: II       Dental  (+) Teeth Intact   Pulmonary Current Smoker,    breath sounds clear to auscultation       Cardiovascular  Rhythm:regular     Neuro/Psych PSYCHIATRIC DISORDERS    GI/Hepatic GERD  ,  Endo/Other    Renal/GU      Musculoskeletal   Abdominal   Peds  Hematology   Anesthesia Other Findings RN informs me pt acting "intoxicated"  Admitted to drinking 40 oz beer at [place], ETOH level pending States npo p mn Tobacco abuse  Reproductive/Obstetrics                             Anesthesia Physical Anesthesia Plan  ASA: II  Anesthesia Plan: MAC   Post-op Pain Management:    Induction:   PONV Risk Score and Plan:   Airway Management Planned:   Additional Equipment:   Intra-op Plan:   Post-operative Plan:   Informed Consent:   Plan Discussed with: Anesthesiologist  Anesthesia Plan Comments:         Anesthesia Quick Evaluation

## 2018-08-19 NOTE — Progress Notes (Signed)
Upon arrival to preop, pt. Stated that he had a 40 ounce beer this morning at midnight. Pt.'s eyes were red & pt was acting very erratic & very loud.  Informed Dr. Bryson Ha & ordered to obtain a blood ethanol level. Level came back at 194.  Dr. Bryson Ha informed as well as Dr. Gala Romney. Dr. Gala Romney in to speak with pt & his mother.  Procedure was cancelled. Dr. Roseanne Kaufman office made aware.

## 2018-08-19 NOTE — Progress Notes (Signed)
Patient presented to short stay drunk.  Blood alcohol level 194.  He is accompanied by his mother. Somewhat belligerent.  Anesthesia canceled the procedure.  I discussed with patient and patient's mother. I informed them this behavior was inconsistent with a productive doctor-patient relationship.

## 2018-08-19 NOTE — Telephone Encounter (Signed)
Noted  

## 2018-08-19 NOTE — Telephone Encounter (Signed)
Christine from Short Stay called to say that RMR cancelled patient's procedure. She said that the patient "fired" RMR.

## 2018-10-09 ENCOUNTER — Ambulatory Visit: Payer: Self-pay | Admitting: Physician Assistant

## 2018-11-30 ENCOUNTER — Other Ambulatory Visit: Payer: Self-pay

## 2018-11-30 ENCOUNTER — Emergency Department (HOSPITAL_COMMUNITY)
Admission: EM | Admit: 2018-11-30 | Discharge: 2018-12-01 | Disposition: A | Discharge status: Home / Self Care | Payer: Self-pay | Attending: Emergency Medicine | Admitting: Emergency Medicine

## 2018-11-30 ENCOUNTER — Emergency Department (HOSPITAL_COMMUNITY): Payer: Self-pay

## 2018-11-30 ENCOUNTER — Encounter (HOSPITAL_COMMUNITY): Payer: Self-pay | Admitting: Emergency Medicine

## 2018-11-30 DIAGNOSIS — F17210 Nicotine dependence, cigarettes, uncomplicated: Secondary | ICD-10-CM | POA: Insufficient documentation

## 2018-11-30 DIAGNOSIS — R1032 Left lower quadrant pain: Secondary | ICD-10-CM | POA: Insufficient documentation

## 2018-11-30 DIAGNOSIS — Z765 Malingerer [conscious simulation]: Secondary | ICD-10-CM | POA: Insufficient documentation

## 2018-11-30 DIAGNOSIS — R112 Nausea with vomiting, unspecified: Secondary | ICD-10-CM | POA: Insufficient documentation

## 2018-11-30 DIAGNOSIS — R1012 Left upper quadrant pain: Secondary | ICD-10-CM

## 2018-11-30 HISTORY — DX: Nausea with vomiting, unspecified: R11.2

## 2018-11-30 HISTORY — DX: Other chronic pain: G89.29

## 2018-11-30 LAB — COMPREHENSIVE METABOLIC PANEL
ALT: 21 U/L (ref 0–44)
AST: 21 U/L (ref 15–41)
Albumin: 4.6 g/dL (ref 3.5–5.0)
Alkaline Phosphatase: 44 U/L (ref 38–126)
Anion gap: 13 (ref 5–15)
BUN: 14 mg/dL (ref 6–20)
CO2: 26 mmol/L (ref 22–32)
Calcium: 10 mg/dL (ref 8.9–10.3)
Chloride: 101 mmol/L (ref 98–111)
Creatinine, Ser: 0.98 mg/dL (ref 0.61–1.24)
GFR calc Af Amer: >60 mL/min (ref >60)
GFR calc non Af Amer: >60 mL/min (ref >60)
Glucose, Bld: 108 mg/dL — ABNORMAL HIGH (ref 70–99)
Potassium: 3.5 mmol/L (ref 3.5–5.1)
Sodium: 140 mmol/L (ref 135–145)
Total Bilirubin: 1 mg/dL (ref 0.3–1.2)
Total Protein: 8.1 g/dL (ref 6.5–8.1)

## 2018-11-30 LAB — CBC WITH DIFFERENTIAL/PLATELET
Abs Immature Granulocytes: 0.04 10*3/uL (ref 0.00–0.07)
Basophils Absolute: 0 10*3/uL (ref 0.0–0.1)
Basophils Relative: 0 %
Eosinophils Absolute: 0 10*3/uL (ref 0.0–0.5)
Eosinophils Relative: 0 %
HCT: 51.6 % (ref 39.0–52.0)
Hemoglobin: 17.3 g/dL — ABNORMAL HIGH (ref 13.0–17.0)
Immature Granulocytes: 0 %
Lymphocytes Relative: 9 %
Lymphs Abs: 0.9 10*3/uL (ref 0.7–4.0)
MCH: 31.7 pg (ref 26.0–34.0)
MCHC: 33.5 g/dL (ref 30.0–36.0)
MCV: 94.5 fL (ref 80.0–100.0)
Monocytes Absolute: 0.4 10*3/uL (ref 0.1–1.0)
Monocytes Relative: 4 %
Neutro Abs: 8.1 10*3/uL — ABNORMAL HIGH (ref 1.7–7.7)
Neutrophils Relative %: 87 %
Platelets: 325 10*3/uL (ref 150–400)
RBC: 5.46 MIL/uL (ref 4.22–5.81)
RDW: 13.2 % (ref 11.5–15.5)
WBC: 9.5 10*3/uL (ref 4.0–10.5)
nRBC: 0 % (ref 0.0–0.2)

## 2018-11-30 LAB — LIPASE, BLOOD: Lipase: 21 U/L (ref 11–51)

## 2018-11-30 LAB — ETHANOL: Alcohol, Ethyl (B): <10 mg/dL (ref <10)

## 2018-11-30 MED ORDER — METOCLOPRAMIDE HCL 5 MG/ML IJ SOLN: 5.0000 mg | Freq: Once | INTRAMUSCULAR | Status: DC

## 2018-11-30 MED ORDER — METOCLOPRAMIDE HCL 5 MG/ML IJ SOLN
10.0000 mg | Freq: Once | INTRAMUSCULAR | Status: AC
  Administered 2018-11-30: 10 mg via INTRAVENOUS
  Filled 2018-11-30: qty 2

## 2018-11-30 MED ORDER — SODIUM CHLORIDE 0.9 % IV BOLUS
1000.0000 mL | Freq: Once | INTRAVENOUS | Status: AC
  Administered 2018-11-30: 1000 mL via INTRAVENOUS

## 2018-11-30 MED ORDER — FAMOTIDINE IN NACL 20-0.9 MG/50ML-% IV SOLN
20.0000 mg | Freq: Once | INTRAVENOUS | Status: AC
  Administered 2018-11-30: 20 mg via INTRAVENOUS
  Filled 2018-11-30: qty 50

## 2018-11-30 MED ORDER — DIPHENHYDRAMINE HCL 50 MG/ML IJ SOLN
25.0000 mg | Freq: Once | INTRAMUSCULAR | Status: AC
  Administered 2018-11-30: 25 mg via INTRAVENOUS
  Filled 2018-11-30: qty 1

## 2018-11-30 MED ORDER — LORAZEPAM 2 MG/ML IJ SOLN
1.0000 mg | Freq: Once | INTRAMUSCULAR | Status: AC
  Administered 2018-11-30: 1 mg via INTRAVENOUS
  Filled 2018-11-30: qty 1

## 2018-11-30 NOTE — ED Provider Notes (Signed)
[facility] EMERGENCY DEPARTMENT Provider Note   CSN: [id number] Arrival date & time: 11/30/18  1920    History   Chief Complaint Chief Complaint  Patient presents with   Abdominal Pain    HPI Ian Roy is a 48 y.o. male.     HPI  Pt was seen at 2050.  Per pt, c/o gradual onset and persistence of constant acute flair of his chronic recurrent LUQ abd "pain" for the past 2 to 3 days. Has been associated with multiple intermittent episodes of N/V.  Describes the abd pain as "aching."  Describes his symptoms as per his chronic/recurrent symptoms pattern. States he "is stressed" about an issue with his girlfriend, which "flairs up" his symptoms. Pt states he was unable to have EGD completed 3 months ago because he "had some alcohol in my system." Denies diarrhea, no fevers, no back pain, no rash, no cough, no CP/SOB, no black or blood in stools or emesis.   The symptoms have been associated with no other complaints. The patient has a significant history of similar symptoms previously, recently being evaluated for this complaint and multiple prior evals for same.      Past Medical History:  Diagnosis Date   Alcohol use    daily   Bronchitis    Chronic abdominal pain    GERD (gastroesophageal reflux disease)    Nausea and vomiting    recurrent    Patient Active Problem List   Diagnosis Date Noted   Family history of colonic polyps 02/18/2018   Elevated LFTs 03/15/2016   Emesis 11/07/2015   Esophageal reflux 11/07/2015   Diarrhea 11/07/2015   Alcohol abuse 11/02/2015   Cigarette nicotine dependence without complication 31/54/0086    Past Surgical History:  Procedure Laterality Date   colonoscopy     COLONOSCOPY WITH PROPOFOL N/A 04/01/2018   Procedure: COLONOSCOPY WITH PROPOFOL;  Surgeon: Daneil Dolin, MD;  Location: [facility];  Service: Endoscopy;  Laterality: N/A;  9:15am   None     POLYPECTOMY  04/01/2018   Procedure: POLYPECTOMY;   Surgeon: Daneil Dolin, MD;  Location: [facility];  Service: Endoscopy;;        Home Medications    Prior to Admission medications   Medication Sig Start Date End Date Taking? Authorizing Provider  acetaminophen (TYLENOL) 500 MG tablet Take 500 mg by mouth every 6 (six) hours as needed for mild pain or moderate pain.   Yes [provider]  ibuprofen (ADVIL) 200 MG tablet Take 200 mg by mouth every 6 (six) hours as needed for fever or moderate pain.   Yes [provider]    Family History Family History  Problem Relation Age of Onset   Cancer Mother        breast   Diabetes Mother    Colon polyps Father    Cancer Maternal Aunt    Colon cancer Neg Hx     Social History Social History   Tobacco Use   Smoking status: Current Every Day Smoker    Packs/day: 1.00    Years: 14.00    Pack years: 14.00    Types: Cigarettes   Smokeless tobacco: Never Used  Substance Use Topics   Alcohol use: Yes    Alcohol/week: 6.0 standard drinks    Types: 6 Cans of beer per week    Comment: "I've cut back"   Drug use: No     Allergies   Patient has no known allergies.  Review of Systems Review of Systems ROS: Statement: All systems negative except as marked or noted in the HPI; Constitutional: Negative for fever and chills. ; ; Eyes: Negative for eye pain, redness and discharge. ; ; ENMT: Negative for ear pain, hoarseness, nasal congestion, sinus pressure and sore throat. ; ; Cardiovascular: Negative for chest pain, palpitations, diaphoresis, dyspnea and peripheral edema. ; ; Respiratory: Negative for cough, wheezing and stridor. ; ; Gastrointestinal: +N/V, abd pain. Negative for diarrhea, blood in stool, hematemesis, jaundice and rectal bleeding. . ; ; Genitourinary: Negative for dysuria, flank pain and hematuria. ; ; Musculoskeletal: Negative for back pain and neck pain. Negative for swelling and trauma.; ; Skin: Negative for pruritus, rash, abrasions,  blisters, bruising and skin lesion.; ; Neuro: Negative for headache, lightheadedness and neck stiffness. Negative for weakness, altered level of consciousness, altered mental status, extremity weakness, paresthesias, involuntary movement, seizure and syncope.       Physical Exam Updated Vital Signs BP (!) 160/90 (BP Location: Right Arm)    Pulse 69    Temp 98.2 F (36.8 C) (Oral)    Resp 16    Ht 6' (1.829 m)    Wt 86.2 kg    SpO2 100%    BMI 25.77 kg/m   Physical Exam 2055: Physical examination:  Nursing notes reviewed; Vital signs and O2 SAT reviewed;  Constitutional: Well developed, Well nourished, Well hydrated, Loud, demanding of ED staff; Head:  Normocephalic, atraumatic; Eyes: EOMI, PERRL, No scleral icterus; ENMT: Mouth and pharynx normal, Mucous membranes moist; Neck: Supple, Full range of motion, No lymphadenopathy; Cardiovascular: Regular rate and rhythm, No gallop; Respiratory: Breath sounds clear & equal bilaterally, No wheezes.  Speaking full sentences with ease, Normal respiratory effort/excursion; Chest: Nontender, Movement normal; Abdomen: Soft, Nontender, Nondistended, Normal bowel sounds; Genitourinary: No CVA tenderness; Extremities: Peripheral pulses normal, No tenderness, No edema, No calf edema or asymmetry.; Neuro: AA&Ox3, Major CN grossly intact.  Speech clear. No gross focal motor or sensory deficits in extremities.; Skin: Color normal, Warm, Dry.; Psych:  Laying with eyes closed and head turned away from me with his arm on his forehead the entire HPI/H&P while demanding "strong pain medicine."    ED Treatments / Results  Labs (all labs ordered are listed, but only abnormal results are displayed)   EKG None  Radiology   Procedures Procedures (including critical care time)  Medications Ordered in ED Medications  famotidine (PEPCID) IVPB 20 mg premix (has no administration in time range)  LORazepam (ATIVAN) injection 1 mg (1 mg Intravenous Given 11/30/18 2114)    diphenhydrAMINE (BENADRYL) injection 25 mg (25 mg Intravenous Given 11/30/18 2114)  metoCLOPramide (REGLAN) injection 10 mg (10 mg Intravenous Given 11/30/18 2113)     Initial Impression / Assessment and Plan / ED Course  I have reviewed the triage vital signs and the nursing notes.  Pertinent labs & imaging results that were available during my care of the patient were reviewed by me and considered in my medical decision making (see chart for details).     MDM Reviewed: previous chart, nursing note and vitals Reviewed previous: labs and CT scan Interpretation: labs and x-ray   Results for orders placed or performed during the hospital encounter of 11/30/18  Comprehensive metabolic panel  Result Value Ref Range   Sodium 140 135 - 145 mmol/L   Potassium 3.5 3.5 - 5.1 mmol/L   Chloride 101 98 - 111 mmol/L   CO2 26 22 - 32 mmol/L   Glucose,  Bld 108 (H) 70 - 99 mg/dL   BUN 14 6 - 20 mg/dL   Creatinine, Ser 0.98 0.61 - 1.24 mg/dL   Calcium 10.0 8.9 - 10.3 mg/dL   Total Protein 8.1 6.5 - 8.1 g/dL   Albumin 4.6 3.5 - 5.0 g/dL   AST 21 15 - 41 U/L   ALT 21 0 - 44 U/L   Alkaline Phosphatase 44 38 - 126 U/L   Total Bilirubin 1.0 0.3 - 1.2 mg/dL   GFR calc non Af Amer >60 >60 mL/min   GFR calc Af Amer >60 >60 mL/min   Anion gap 13 5 - 15  Lipase, blood  Result Value Ref Range   Lipase 21 11 - 51 U/L  CBC with Differential  Result Value Ref Range   WBC 9.5 4.0 - 10.5 K/uL   RBC 5.46 4.22 - 5.81 MIL/uL   Hemoglobin 17.3 (H) 13.0 - 17.0 g/dL   HCT 51.6 39.0 - 52.0 %   MCV 94.5 80.0 - 100.0 fL   MCH 31.7 26.0 - 34.0 pg   MCHC 33.5 30.0 - 36.0 g/dL   RDW 13.2 11.5 - 15.5 %   Platelets 325 150 - 400 K/uL   nRBC 0.0 0.0 - 0.2 %   Neutrophils Relative % 87 %   Neutro Abs 8.1 (H) 1.7 - 7.7 K/uL   Lymphocytes Relative 9 %   Lymphs Abs 0.9 0.7 - 4.0 K/uL   Monocytes Relative 4 %   Monocytes Absolute 0.4 0.1 - 1.0 K/uL   Eosinophils Relative 0 %   Eosinophils Absolute 0.0 0.0 -  0.5 K/uL   Basophils Relative 0 %   Basophils Absolute 0.0 0.0 - 0.1 K/uL   Immature Granulocytes 0 %   Abs Immature Granulocytes 0.04 0.00 - 0.07 K/uL  Ethanol  Result Value Ref Range   Alcohol, Ethyl (B) <10 <10 mg/dL   Dg Abd Acute W/chest Result Date: 11/30/2018 CLINICAL DATA:  Nausea, vomiting hand diarrhea EXAM: DG ABDOMEN ACUTE W/ 1V CHEST COMPARISON:  06/27/2018 FINDINGS: Cardiac shadows within normal limits. The lungs are well aerated bilaterally. No focal infiltrate or sizable effusion is seen. Scattered large and small bowel gas is noted. No abnormal mass or abnormal calcifications are seen. No free air is noted. Degenerative changes of lumbar and thoracic spines are seen. IMPRESSION: Chronic changes without acute abnormality. Electronically Signed   By: Inez Catalina M.D.   On: 11/30/2018 22:14    2355: Pt sleeping soundly after meds. No N/V or stooling while in the ED. Workup reassuring. Pt will need UA and PO challenge before dispo. Sign out to Dr. Roxanne Mins.    Final Clinical Impressions(s) / ED Diagnoses   Final diagnoses:  None    ED Discharge Orders    None       Francine Graven, DO 12/01/18 0003

## 2018-11-30 NOTE — ED Notes (Signed)
Patient is actively snoring at this time with symmetrical rise and fall of chest

## 2018-11-30 NOTE — ED Notes (Signed)
Patient has been asked multiple times to provide a urine specimen and has been provided with a urinal.

## 2018-11-30 NOTE — ED Notes (Signed)
Water provided to the patient for a PO challenge

## 2018-11-30 NOTE — ED Notes (Signed)
Patient transported to X-ray 

## 2018-11-30 NOTE — ED Notes (Signed)
Informed patient we still need a urine sample and for him drink his water

## 2018-11-30 NOTE — ED Triage Notes (Signed)
Patient complains of abdominal pain that began 2 days ago. Patient states he has vomited 40 times, denies diarrhea.

## 2018-12-01 LAB — URINALYSIS, ROUTINE W REFLEX MICROSCOPIC
Bilirubin Urine: NEGATIVE
Glucose, UA: NEGATIVE mg/dL
Hgb urine dipstick: NEGATIVE
Ketones, ur: 80 mg/dL — AB
Leukocytes,Ua: NEGATIVE
Nitrite: NEGATIVE
Protein, ur: NEGATIVE mg/dL
Specific Gravity, Urine: 1.027 (ref 1.005–1.030)
pH: 5 (ref 5.0–8.0)

## 2018-12-01 LAB — RAPID URINE DRUG SCREEN, HOSP PERFORMED
Amphetamines: NOT DETECTED
Barbiturates: NOT DETECTED
Benzodiazepines: NOT DETECTED
Cocaine: NOT DETECTED
Opiates: NOT DETECTED
Tetrahydrocannabinol: NOT DETECTED

## 2018-12-01 MED ORDER — LIDOCAINE VISCOUS HCL 2 % MT SOLN
15.0000 mL | Freq: Once | OROMUCOSAL | Status: AC
  Administered 2018-12-01: 15 mL via ORAL
  Filled 2018-12-01: qty 15

## 2018-12-01 MED ORDER — HYDROXYZINE HCL 25 MG PO TABS: 25.0000 mg | ORAL_TABLET | Freq: Four times a day (QID) | ORAL | 0 refills | Status: DC | PRN

## 2018-12-01 MED ORDER — METOCLOPRAMIDE HCL 10 MG PO TABS: 10.0000 mg | ORAL_TABLET | Freq: Four times a day (QID) | ORAL | 0 refills | Status: DC | PRN

## 2018-12-01 MED ORDER — FAMOTIDINE 20 MG PO TABS: 20.0000 mg | ORAL_TABLET | Freq: Two times a day (BID) | ORAL | 0 refills | Status: DC

## 2018-12-01 MED ORDER — ACETAMINOPHEN 325 MG PO TABS
650.0000 mg | ORAL_TABLET | Freq: Once | ORAL | Status: AC
  Administered 2018-12-01: 650 mg via ORAL
  Filled 2018-12-01: qty 2

## 2018-12-01 MED ORDER — ALUM & MAG HYDROXIDE-SIMETH 200-200-20 MG/5ML PO SUSP
30.0000 mL | Freq: Once | ORAL | Status: AC
  Administered 2018-12-01: 30 mL via ORAL
  Filled 2018-12-01: qty 30

## 2018-12-01 NOTE — ED Notes (Signed)
Pt refused vitals 

## 2018-12-01 NOTE — Discharge Instructions (Signed)
Avoid alcohol, aspirin, nonsteroidal antiinflammatory drugs (like ibuprofen, naproxen).  Take antacids (like Maalox, Mylanta, Tums, Pepto Bismol) as needed.

## 2018-12-01 NOTE — ED Provider Notes (Addendum)
Care assumed from Dr. Thurnell Garbe, patient presenting with left upper quadrant pain and nausea and vomiting.  He was given IV fluids, diphenhydramine, famotidine, lorazepam, metoclopramide and is feeling much better.  Labs are unremarkable.  He is requesting a 3-day course of pain pills.  On review of past records, on one occasion he was given a prescription for oxycodone-acetaminophen.  I see no indication for giving narcotics at this point.  He is discharged with prescriptions for famotidine and metoclopramide.  Also given prescription for hydroxyzine for anxiety.  Advised to stop smoking.  Patient was insisting on narcotic prescriptions stating that when he wakes up at night, if he takes a narcotic he is fine.  He is requesting a 3-day supply.  He states that he has been given this every time he has been to the emergency department with this complaint.  His record were reviewed, he has 4 prior ED visits for similar complaints and on one occasion did receive a prescription for oxycodone-acetaminophen.  I reviewed his record on the [place] controlled substance reporting website, and he does not have an excessive number of narcotic prescriptions.  However, this is clearly drug-seeking behavior.  Once he was told that he would not get narcotic prescription, he stated that his pain was up to 4/10.  He is given a GI cocktail and a dose of acetaminophen and advised he would not get narcotic prescriptions for this.  Results for orders placed or performed during the hospital encounter of 11/30/18  Comprehensive metabolic panel  Result Value Ref Range   Sodium 140 135 - 145 mmol/L   Potassium 3.5 3.5 - 5.1 mmol/L   Chloride 101 98 - 111 mmol/L   CO2 26 22 - 32 mmol/L   Glucose, Bld 108 (H) 70 - 99 mg/dL   BUN 14 6 - 20 mg/dL   Creatinine, Ser 0.98 0.61 - 1.24 mg/dL   Calcium 10.0 8.9 - 10.3 mg/dL   Total Protein 8.1 6.5 - 8.1 g/dL   Albumin 4.6 3.5 - 5.0 g/dL   AST 21 15 - 41 U/L   ALT 21 0 - 44 U/L   Alkaline Phosphatase 44 38 - 126 U/L   Total Bilirubin 1.0 0.3 - 1.2 mg/dL   GFR calc non Af Amer >60 >60 mL/min   GFR calc Af Amer >60 >60 mL/min   Anion gap 13 5 - 15  Lipase, blood  Result Value Ref Range   Lipase 21 11 - 51 U/L  CBC with Differential  Result Value Ref Range   WBC 9.5 4.0 - 10.5 K/uL   RBC 5.46 4.22 - 5.81 MIL/uL   Hemoglobin 17.3 (H) 13.0 - 17.0 g/dL   HCT 51.6 39.0 - 52.0 %   MCV 94.5 80.0 - 100.0 fL   MCH 31.7 26.0 - 34.0 pg   MCHC 33.5 30.0 - 36.0 g/dL   RDW 13.2 11.5 - 15.5 %   Platelets 325 150 - 400 K/uL   nRBC 0.0 0.0 - 0.2 %   Neutrophils Relative % 87 %   Neutro Abs 8.1 (H) 1.7 - 7.7 K/uL   Lymphocytes Relative 9 %   Lymphs Abs 0.9 0.7 - 4.0 K/uL   Monocytes Relative 4 %   Monocytes Absolute 0.4 0.1 - 1.0 K/uL   Eosinophils Relative 0 %   Eosinophils Absolute 0.0 0.0 - 0.5 K/uL   Basophils Relative 0 %   Basophils Absolute 0.0 0.0 - 0.1 K/uL   Immature Granulocytes 0 %  Abs Immature Granulocytes 0.04 0.00 - 0.07 K/uL  Urinalysis, Routine w reflex microscopic  Result Value Ref Range   Color, Urine YELLOW YELLOW   APPearance CLEAR CLEAR   Specific Gravity, Urine 1.027 1.005 - 1.030   pH 5.0 5.0 - 8.0   Glucose, UA NEGATIVE NEGATIVE mg/dL   Hgb urine dipstick NEGATIVE NEGATIVE   Bilirubin Urine NEGATIVE NEGATIVE   Ketones, ur 80 (A) NEGATIVE mg/dL   Protein, ur NEGATIVE NEGATIVE mg/dL   Nitrite NEGATIVE NEGATIVE   Leukocytes,Ua NEGATIVE NEGATIVE  Urine rapid drug screen (hosp performed)  Result Value Ref Range   Opiates NONE DETECTED NONE DETECTED   Cocaine NONE DETECTED NONE DETECTED   Benzodiazepines NONE DETECTED NONE DETECTED   Amphetamines NONE DETECTED NONE DETECTED   Tetrahydrocannabinol NONE DETECTED NONE DETECTED   Barbiturates NONE DETECTED NONE DETECTED  Ethanol  Result Value Ref Range   Alcohol, Ethyl (B) <10 <10 mg/dL   Dg Abd Acute W/chest  Result Date: 11/30/2018 CLINICAL DATA:  Nausea, vomiting hand diarrhea EXAM:  DG ABDOMEN ACUTE W/ 1V CHEST COMPARISON:  06/27/2018 FINDINGS: Cardiac shadows within normal limits. The lungs are well aerated bilaterally. No focal infiltrate or sizable effusion is seen. Scattered large and small bowel gas is noted. No abnormal mass or abnormal calcifications are seen. No free air is noted. Degenerative changes of lumbar and thoracic spines are seen. IMPRESSION: Chronic changes without acute abnormality. Electronically Signed   By: Inez Catalina M.D.   On: 78/97/8478 41:28      Delora Fuel, MD 20/81/38 [id number]    Delora Fuel, MD 59/74/71 [phone]

## 2018-12-02 ENCOUNTER — Other Ambulatory Visit: Payer: Self-pay

## 2018-12-02 ENCOUNTER — Emergency Department (HOSPITAL_COMMUNITY)
Admission: EM | Admit: 2018-12-02 | Discharge: 2018-12-02 | Disposition: A | Discharge status: Home / Self Care | Payer: Self-pay | Attending: Emergency Medicine | Admitting: Emergency Medicine

## 2018-12-02 ENCOUNTER — Encounter (HOSPITAL_COMMUNITY): Payer: Self-pay | Admitting: *Deleted

## 2018-12-02 DIAGNOSIS — G8929 Other chronic pain: Secondary | ICD-10-CM | POA: Insufficient documentation

## 2018-12-02 DIAGNOSIS — F17210 Nicotine dependence, cigarettes, uncomplicated: Secondary | ICD-10-CM | POA: Insufficient documentation

## 2018-12-02 DIAGNOSIS — R1012 Left upper quadrant pain: Secondary | ICD-10-CM | POA: Insufficient documentation

## 2018-12-02 LAB — COMPREHENSIVE METABOLIC PANEL
ALT: 21 U/L (ref 0–44)
AST: 19 U/L (ref 15–41)
Albumin: 4.4 g/dL (ref 3.5–5.0)
Alkaline Phosphatase: 47 U/L (ref 38–126)
Anion gap: 12 (ref 5–15)
BUN: 11 mg/dL (ref 6–20)
CO2: 28 mmol/L (ref 22–32)
Calcium: 9.7 mg/dL (ref 8.9–10.3)
Chloride: 96 mmol/L — ABNORMAL LOW (ref 98–111)
Creatinine, Ser: 1.02 mg/dL (ref 0.61–1.24)
GFR calc Af Amer: >60 mL/min (ref >60)
GFR calc non Af Amer: >60 mL/min (ref >60)
Glucose, Bld: 103 mg/dL — ABNORMAL HIGH (ref 70–99)
Potassium: 3.4 mmol/L — ABNORMAL LOW (ref 3.5–5.1)
Sodium: 136 mmol/L (ref 135–145)
Total Bilirubin: 0.8 mg/dL (ref 0.3–1.2)
Total Protein: 7.5 g/dL (ref 6.5–8.1)

## 2018-12-02 LAB — URINALYSIS, ROUTINE W REFLEX MICROSCOPIC
Bacteria, UA: NONE SEEN
Bilirubin Urine: NEGATIVE
Glucose, UA: >=500 mg/dL — AB
Hgb urine dipstick: NEGATIVE
Ketones, ur: 80 mg/dL — AB
Leukocytes,Ua: NEGATIVE
Nitrite: NEGATIVE
Protein, ur: 30 mg/dL — AB
Specific Gravity, Urine: 1.04 — ABNORMAL HIGH (ref 1.005–1.030)
pH: 7 (ref 5.0–8.0)

## 2018-12-02 LAB — CBC
HCT: 51.1 % (ref 39.0–52.0)
Hemoglobin: 17.5 g/dL — ABNORMAL HIGH (ref 13.0–17.0)
MCH: 32.1 pg (ref 26.0–34.0)
MCHC: 34.2 g/dL (ref 30.0–36.0)
MCV: 93.8 fL (ref 80.0–100.0)
Platelets: 333 10*3/uL (ref 150–400)
RBC: 5.45 MIL/uL (ref 4.22–5.81)
RDW: 13 % (ref 11.5–15.5)
WBC: 8.6 10*3/uL (ref 4.0–10.5)
nRBC: 0 % (ref 0.0–0.2)

## 2018-12-02 LAB — LIPASE, BLOOD: Lipase: 22 U/L (ref 11–51)

## 2018-12-02 MED ORDER — SUCRALFATE 1 GM/10ML PO SUSP: 1.0000 g | Freq: Three times a day (TID) | ORAL | 0 refills | Status: DC

## 2018-12-02 MED ORDER — ONDANSETRON 4 MG PO TBDP
4.0000 mg | ORAL_TABLET | Freq: Once | ORAL | Status: AC | PRN
  Administered 2018-12-02: 4 mg via ORAL
  Filled 2018-12-02: qty 1

## 2018-12-02 MED ORDER — ESOMEPRAZOLE MAGNESIUM 40 MG PO CPDR: 40.0000 mg | DELAYED_RELEASE_CAPSULE | Freq: Every day | ORAL | 0 refills | Status: DC

## 2018-12-02 MED ORDER — PANTOPRAZOLE SODIUM 40 MG IV SOLR
40.0000 mg | Freq: Once | INTRAVENOUS | Status: AC
  Administered 2018-12-02: 40 mg via INTRAVENOUS
  Filled 2018-12-02: qty 40

## 2018-12-02 MED ORDER — DOXYCYCLINE HYCLATE 100 MG PO TABS
200.0000 mg | ORAL_TABLET | Freq: Once | ORAL | Status: AC
  Administered 2018-12-02: 200 mg via ORAL
  Filled 2018-12-02: qty 2

## 2018-12-02 MED ORDER — MORPHINE SULFATE (PF) 4 MG/ML IV SOLN
4.0000 mg | Freq: Once | INTRAVENOUS | Status: AC
  Administered 2018-12-02: 4 mg via INTRAVENOUS
  Filled 2018-12-02: qty 1

## 2018-12-02 MED ORDER — ALUM & MAG HYDROXIDE-SIMETH 200-200-20 MG/5ML PO SUSP
30.0000 mL | Freq: Once | ORAL | Status: AC
  Administered 2018-12-02: 30 mL via ORAL
  Filled 2018-12-02: qty 30

## 2018-12-02 MED ORDER — SODIUM CHLORIDE 0.9 % IV BOLUS
1000.0000 mL | Freq: Once | INTRAVENOUS | Status: AC
  Administered 2018-12-02: 1000 mL via INTRAVENOUS

## 2018-12-02 MED ORDER — SUCRALFATE 1 G PO TABS
1.0000 g | ORAL_TABLET | Freq: Once | ORAL | Status: AC
  Administered 2018-12-02: 1 g via ORAL
  Filled 2018-12-02: qty 1

## 2018-12-02 MED ORDER — ONDANSETRON 4 MG PO TBDP: 4.0000 mg | ORAL_TABLET | Freq: Three times a day (TID) | ORAL | 0 refills | Status: DC | PRN

## 2018-12-02 MED ORDER — SODIUM CHLORIDE 0.9% FLUSH: 3.0000 mL | Freq: Once | INTRAVENOUS | Status: DC

## 2018-12-02 MED ORDER — GI COCKTAIL ~~LOC~~: 30.0000 mL | Freq: Every day | ORAL | 0 refills | Status: DC

## 2018-12-02 MED ORDER — LIDOCAINE VISCOUS HCL 2 % MT SOLN
15.0000 mL | Freq: Once | OROMUCOSAL | Status: AC
  Administered 2018-12-02: 15 mL via ORAL
  Filled 2018-12-02: qty 15

## 2018-12-02 NOTE — Discharge Instructions (Addendum)
Continue taking Pepcid and resume taking Nexium.  Stop taking Reglan and take Zofran every 8 hours as needed for nausea or vomiting.  Begin taking Carafate 4 times daily before meals and at bedtime.  Take GI cocktail as prescribed daily as needed.  You may need to take this to a compound pharmacy.  Please return to the emergency department if you develop any new or worsening symptoms.  It is important that you follow-up with a GI doctor for an upper endoscopy.  I have put in a referral, however you still may need referral from your primary care provider.  It is also very important that you stop drinking alcohol completely.  It is also recommended that you avoid acidic foods or drinks (pineapple/orange juice), caffeine, spicy foods, chocolate, coffee, peppermint.

## 2018-12-02 NOTE — ED Notes (Signed)
ED Provider at bedside. 

## 2018-12-02 NOTE — ED Notes (Signed)
PT would not sit still for ekg.

## 2018-12-02 NOTE — ED Notes (Signed)
Patient verbalizes understanding of discharge instructions. Opportunity for questioning and answers were provided. Armband removed by staff, pt discharged from ED.  

## 2018-12-02 NOTE — ED Triage Notes (Signed)
Pt states pain to LUQ pain since Fri.  Was seen at [facility], but pain continues.  Pt states he is under a lot of stress.

## 2018-12-02 NOTE — ED Provider Notes (Addendum)
[facility] EMERGENCY DEPARTMENT Provider Note   CSN: [id number] Arrival date & time: 12/02/18  1811    History   Chief Complaint Chief Complaint  Patient presents with   Abdominal Pain    HPI Ian Roy is a 48 y.o. male with history of chronic abdominal pain, alcohol abuse, GERD who presents with ongoing abdominal pain that began 4 days ago.  It is a constant, gnawing pain in left upper quadrant.  He has had nausea and vomiting.  He had one episode of blood-streaked emesis 3 days ago, which has not recurred.  He was seen 2 days ago and prescribed Pepcid, Reglan, and Vistaril.  He reports his symptoms have not been helping.  He has not been eating anything.  He is not able to keep water or Gatorade down.  He reports he has not drank any alcohol for 1 week.  Patient reports he had an episode of heartburn-like chest pain earlier today.  Patient reports he was supposed to get an EGD in February, however his gastroenterologist could not do it because he had alcohol in his system.  He has not followed up since.  He has had several prior negative work-ups.  This pain is the same that he has been dealing with.  Patient also reports that he was bit by a tick about a week ago.  He denies any fever headache, but does still have an area of redness on his left shoulder.     HPI  Past Medical History:  Diagnosis Date   Alcohol use    daily   Bronchitis    Chronic abdominal pain    GERD (gastroesophageal reflux disease)    Nausea and vomiting    recurrent    Patient Active Problem List   Diagnosis Date Noted   Family history of colonic polyps 02/18/2018   Elevated LFTs 03/15/2016   Emesis 11/07/2015   Esophageal reflux 11/07/2015   Diarrhea 11/07/2015   Alcohol abuse 11/02/2015   Cigarette nicotine dependence without complication 78/24/2353    Past Surgical History:  Procedure Laterality Date   colonoscopy     COLONOSCOPY WITH PROPOFOL N/A  04/01/2018   Procedure: COLONOSCOPY WITH PROPOFOL;  Surgeon: Daneil Dolin, MD;  [facility];  Service: Endoscopy;  Laterality: N/A;  9:15am   None     POLYPECTOMY  04/01/2018   Procedure: POLYPECTOMY;  Surgeon: Daneil Dolin, MD;  Location: [facility];  Service: Endoscopy;;        Home Medications    Prior to Admission medications   Medication Sig Start Date End Date Taking? Authorizing Provider  acetaminophen (TYLENOL) 500 MG tablet Take 500 mg by mouth every 6 (six) hours as needed for mild pain or moderate pain.    [provider]  Alum & Mag Hydroxide-Simeth (GI COCKTAIL) SUSP suspension Take 30 mLs by mouth daily. Shake well. 12/02/18   Canden Cieslinski, Bea Graff, PA-C  esomeprazole (NEXIUM) 40 MG capsule Take 1 capsule (40 mg total) by mouth daily. 12/02/18   Grey Schlauch, Bea Graff, PA-C  famotidine (PEPCID) 20 MG tablet Take 1 tablet (20 mg total) by mouth 2 (two) times daily. 12/21/41   Delora Fuel, MD  hydrOXYzine (ATARAX/VISTARIL) 25 MG tablet Take 1 tablet (25 mg total) by mouth every 6 (six) hours as needed for anxiety. 1/54/00   Delora Fuel, MD  ibuprofen (ADVIL) 200 MG tablet Take 200 mg by mouth every 6 (six) hours as needed for fever or  moderate pain.    [provider]  ondansetron (ZOFRAN ODT) 4 MG disintegrating tablet Take 1 tablet (4 mg total) by mouth every 8 (eight) hours as needed for nausea or vomiting. 12/02/18   Rotha Cassels, Bea Graff, PA-C  sucralfate (CARAFATE) 1 GM/10ML suspension Take 10 mLs (1 g total) by mouth 4 (four) times daily -  with meals and at bedtime. 12/02/18   Frederica Kuster, PA-C    Family History Family History  Problem Relation Age of Onset   Cancer Mother        breast   Diabetes Mother    Colon polyps Father    Cancer Maternal Aunt    Colon cancer Neg Hx     Social History Social History   Tobacco Use   Smoking status: Current Every Day Smoker    Packs/day: 1.00    Years: 14.00    Pack years: 14.00     Types: Cigarettes   Smokeless tobacco: Never Used  Substance Use Topics   Alcohol use: Yes    Alcohol/week: 6.0 standard drinks    Types: 6 Cans of beer per week    Comment: Last drank last Wed   Drug use: No     Allergies   Patient has no known allergies.   Review of Systems Review of Systems  Constitutional: Negative for chills and fever.  HENT: Negative for facial swelling and sore throat.   Respiratory: Negative for shortness of breath.   Cardiovascular: Negative for chest pain.  Gastrointestinal: Positive for abdominal pain, nausea and vomiting. Negative for blood in stool and diarrhea.  Genitourinary: Negative for dysuria.  Musculoskeletal: Negative for back pain.  Skin: Negative for rash and wound.  Neurological: Negative for headaches.  Psychiatric/Behavioral: The patient is not nervous/anxious.      Physical Exam Updated Vital Signs BP 108/82    Pulse 88    Temp 97.9 F (36.6 C) (Oral)    Resp 20    SpO2 97%   Physical Exam Vitals signs and nursing note reviewed.  Constitutional:      General: He is not in acute distress.    Appearance: He is well-developed. He is not diaphoretic.  HENT:     Head: Normocephalic and atraumatic.     Mouth/Throat:     Pharynx: No oropharyngeal exudate.  Eyes:     General: No scleral icterus.       Right eye: No discharge.        Left eye: No discharge.     Conjunctiva/sclera: Conjunctivae normal.     Pupils: Pupils are equal, round, and reactive to light.  Neck:     Musculoskeletal: Normal range of motion and neck supple.     Thyroid: No thyromegaly.  Cardiovascular:     Rate and Rhythm: Normal rate and regular rhythm.     Heart sounds: Normal heart sounds. No murmur. No friction rub. No gallop.   Pulmonary:     Effort: Pulmonary effort is normal. No respiratory distress.     Breath sounds: Normal breath sounds. No stridor. No wheezing or rales.  Abdominal:     General: Bowel sounds are normal. There is no  distension.     Palpations: Abdomen is soft.     Tenderness: There is no abdominal tenderness. There is no guarding or rebound. Negative signs include Murphy's sign, Rovsing's sign and McBurney's sign.  Lymphadenopathy:     Cervical: No cervical adenopathy.  Skin:    General: Skin is warm  and dry.     Coloration: Skin is not pale.     Findings: No rash.     Comments: Nickel sized area of redness and scab to left shoulder; no significant warmth, drainage, or erythema  Neurological:     Mental Status: He is alert.     Coordination: Coordination normal.  Psychiatric:        Mood and Affect: Mood is anxious.        Speech: Speech is rapid and pressured.      ED Treatments / Results  Labs (all labs ordered are listed, but only abnormal results are displayed) Labs Reviewed  COMPREHENSIVE METABOLIC PANEL - Abnormal; Notable for the following components:      Result Value   Potassium 3.4 (*)    Chloride 96 (*)    Glucose, Bld 103 (*)    All other components within normal limits  CBC - Abnormal; Notable for the following components:   Hemoglobin 17.5 (*)    All other components within normal limits  URINALYSIS, ROUTINE W REFLEX MICROSCOPIC - Abnormal; Notable for the following components:   Color, Urine AMBER (*)    Specific Gravity, Urine 1.040 (*)    Glucose, UA >=500 (*)    Ketones, ur 80 (*)    Protein, ur 30 (*)    All other components within normal limits  LIPASE, BLOOD    EKG EKG Interpretation  Date/Time:  Monday Dec 02 2018 21:00:05 EDT Ventricular Rate:  62 PR Interval:    QRS Duration: 95 QT Interval:  396 QTC Calculation: 403 R Axis:   73 Text Interpretation:  Sinus rhythm No significant change since last tracing Confirmed by Deno Etienne [phone]) on 12/02/2018 9:49:47 PM   Radiology No results found.  Procedures Procedures (including critical care time)  Medications Ordered in ED Medications  sodium chloride flush (NS) 0.9 % injection 3 mL (3 mLs  Intravenous Not Given 12/02/18 1934)  doxycycline (VIBRA-TABS) tablet 200 mg (has no administration in time range)  ondansetron (ZOFRAN-ODT) disintegrating tablet 4 mg (4 mg Oral Given 12/02/18 1838)  sodium chloride 0.9 % bolus 1,000 mL (0 mLs Intravenous Stopped 12/02/18 2229)  pantoprazole (PROTONIX) injection 40 mg (40 mg Intravenous Given 12/02/18 2000)  sucralfate (CARAFATE) tablet 1 g (1 g Oral Given 12/02/18 2003)  alum & mag hydroxide-simeth (MAALOX/MYLANTA) 200-200-20 MG/5ML suspension 30 mL (30 mLs Oral Given 12/02/18 2002)    And  lidocaine (XYLOCAINE) 2 % viscous mouth solution 15 mL (15 mLs Oral Given 12/02/18 2002)  morphine 4 MG/ML injection 4 mg (4 mg Intravenous Given 12/02/18 2229)     Initial Impression / Assessment and Plan / ED Course  I have reviewed the triage vital signs and the nursing notes.  Pertinent labs & imaging results that were available during my care of the patient were reviewed by me and considered in my medical decision making (see chart for details).        Patient presenting with acute on chronic left upper quadrant pain.  Suspect patient has an ulcer or alcoholic gastritis.  Patient was evaluated 2 [facility] and was discharged home with Pepcid, Reglan, and Vistaril.  He was apparently exhibiting drug-seeking behavior.  He was improved with out narcotics here temporarily, but did have return of pain and so a single dose of morphine was given.  Patient advised that he needs to stop drinking alcohol completely and given counseling on diet changes (he was drinking pineapple juice on  my exam).  Patient vies to continue Pepcid, but will replace Reglan with Zofran ODT considering he was unable to keep down the Reglan once he began vomiting.  We will also add Nexium, as he reports he was supposed to be on this prior.  We will also add Carafate and GI cocktail, as patient reports these helped him in the ED.  Advised that it is very important that he  follow-up with gastroenterology for an EGD and for further evaluation of his ongoing symptoms.  This is his eighth visit for similar symptoms in the past 2 years.  His abdominal exam is nonfocal and he has no tenderness.  I do not feel any imaging is indicated today.  He had a negative acute abdominal series 2 days ago.  Patient has blood streaking of emesis 2 days ago is most likely Mallory-Weiss tear.  This has not continued.  He denies any symptoms of melena.  Patient concerned about a tickborne illness considering he pulled a tick off of him 1 week ago.  I feel single dose of doxycycline 200 mg for prophylaxis is reasonable.  Strict return precautions given.  Patient understands and agrees with plan.  Significant time was spent answering patient's questions and explaining the plan of care and recommendations.  Patient vitals stable throughout ED course and discharged in satisfactory condition.  Final Clinical Impressions(s) / ED Diagnoses   Final diagnoses:  Chronic abdominal pain  LUQ pain    ED Discharge Orders         Ordered    esomeprazole (NEXIUM) 40 MG capsule  Daily     12/02/18 2304    sucralfate (CARAFATE) 1 GM/10ML suspension  3 times daily with meals & bedtime     12/02/18 2304    Alum & Mag Hydroxide-Simeth (GI COCKTAIL) SUSP suspension  Daily     12/02/18 2304    Ambulatory referral to Gastroenterology     12/02/18 2304    ondansetron (ZOFRAN ODT) 4 MG disintegrating tablet  Every 8 hours PRN     12/02/18 2304               Frederica Kuster, PA-C 12/02/18 [place], [place], DO 12/03/18 1506

## 2019-12-04 ENCOUNTER — Encounter (HOSPITAL_COMMUNITY): Payer: Self-pay | Admitting: Emergency Medicine

## 2019-12-04 ENCOUNTER — Emergency Department (HOSPITAL_COMMUNITY): Payer: Self-pay

## 2019-12-04 ENCOUNTER — Emergency Department (HOSPITAL_COMMUNITY)
Admission: EM | Admit: 2019-12-04 | Discharge: 2019-12-04 | Disposition: A | Discharge status: Home / Self Care | Payer: Self-pay | Attending: Emergency Medicine | Admitting: Emergency Medicine

## 2019-12-04 ENCOUNTER — Other Ambulatory Visit: Payer: Self-pay

## 2019-12-04 DIAGNOSIS — R1013 Epigastric pain: Secondary | ICD-10-CM | POA: Insufficient documentation

## 2019-12-04 DIAGNOSIS — K920 Hematemesis: Secondary | ICD-10-CM | POA: Insufficient documentation

## 2019-12-04 DIAGNOSIS — Z79899 Other long term (current) drug therapy: Secondary | ICD-10-CM | POA: Insufficient documentation

## 2019-12-04 DIAGNOSIS — F17210 Nicotine dependence, cigarettes, uncomplicated: Secondary | ICD-10-CM | POA: Insufficient documentation

## 2019-12-04 LAB — COMPREHENSIVE METABOLIC PANEL
ALT: 17 U/L (ref 0–44)
AST: 19 U/L (ref 15–41)
Albumin: 4.4 g/dL (ref 3.5–5.0)
Alkaline Phosphatase: 51 U/L (ref 38–126)
Anion gap: 16 — ABNORMAL HIGH (ref 5–15)
BUN: 13 mg/dL (ref 6–20)
CO2: 28 mmol/L (ref 22–32)
Calcium: 9.9 mg/dL (ref 8.9–10.3)
Chloride: 91 mmol/L — ABNORMAL LOW (ref 98–111)
Creatinine, Ser: 1.18 mg/dL (ref 0.61–1.24)
GFR calc Af Amer: >60 mL/min (ref >60)
GFR calc non Af Amer: >60 mL/min (ref >60)
Glucose, Bld: 107 mg/dL — ABNORMAL HIGH (ref 70–99)
Potassium: 3.3 mmol/L — ABNORMAL LOW (ref 3.5–5.1)
Sodium: 135 mmol/L (ref 135–145)
Total Bilirubin: 0.6 mg/dL (ref 0.3–1.2)
Total Protein: 8.2 g/dL — ABNORMAL HIGH (ref 6.5–8.1)

## 2019-12-04 LAB — CBC
HCT: 50 % (ref 39.0–52.0)
Hemoglobin: 17 g/dL (ref 13.0–17.0)
MCH: 30.9 pg (ref 26.0–34.0)
MCHC: 34 g/dL (ref 30.0–36.0)
MCV: 90.7 fL (ref 80.0–100.0)
Platelets: 402 10*3/uL — ABNORMAL HIGH (ref 150–400)
RBC: 5.51 MIL/uL (ref 4.22–5.81)
RDW: 13.3 % (ref 11.5–15.5)
WBC: 11.3 10*3/uL — ABNORMAL HIGH (ref 4.0–10.5)
nRBC: 0 % (ref 0.0–0.2)

## 2019-12-04 LAB — LIPASE, BLOOD: Lipase: 19 U/L (ref 11–51)

## 2019-12-04 MED ORDER — LIDOCAINE VISCOUS HCL 2 % MT SOLN
15.0000 mL | Freq: Once | OROMUCOSAL | Status: AC
  Administered 2019-12-04: 15 mL via ORAL
  Filled 2019-12-04: qty 15

## 2019-12-04 MED ORDER — ONDANSETRON HCL 4 MG/2ML IJ SOLN
4.0000 mg | Freq: Once | INTRAMUSCULAR | Status: AC
  Administered 2019-12-04: 4 mg via INTRAVENOUS
  Filled 2019-12-04: qty 2

## 2019-12-04 MED ORDER — ALUM & MAG HYDROXIDE-SIMETH 200-200-20 MG/5ML PO SUSP
30.0000 mL | Freq: Once | ORAL | Status: AC
  Administered 2019-12-04: 30 mL via ORAL
  Filled 2019-12-04: qty 30

## 2019-12-04 MED ORDER — POTASSIUM CHLORIDE CRYS ER 20 MEQ PO TBCR
40.0000 meq | EXTENDED_RELEASE_TABLET | Freq: Once | ORAL | Status: AC
  Administered 2019-12-04: 40 meq via ORAL
  Filled 2019-12-04: qty 2

## 2019-12-04 MED ORDER — POTASSIUM CHLORIDE 20 MEQ PO PACK
40.0000 meq | PACK | Freq: Two times a day (BID) | ORAL | Status: DC
  Filled 2019-12-04: qty 2

## 2019-12-04 MED ORDER — LACTATED RINGERS IV BOLUS
1000.0000 mL | Freq: Once | INTRAVENOUS | Status: AC
  Administered 2019-12-04: 1000 mL via INTRAVENOUS

## 2019-12-04 MED ORDER — GI COCKTAIL ~~LOC~~: 30.0000 mL | Freq: Every day | ORAL | 0 refills | Status: DC

## 2019-12-04 MED ORDER — FAMOTIDINE 20 MG PO TABS: 20.0000 mg | ORAL_TABLET | Freq: Two times a day (BID) | ORAL | 0 refills | Status: DC

## 2019-12-04 MED ORDER — FAMOTIDINE 40 MG/5ML PO SUSR
20.0000 mg | Freq: Once | ORAL | Status: DC
  Filled 2019-12-04: qty 2.5

## 2019-12-04 MED ORDER — ONDANSETRON 4 MG PO TBDP: 4.0000 mg | ORAL_TABLET | Freq: Three times a day (TID) | ORAL | 0 refills | Status: DC | PRN

## 2019-12-04 MED ORDER — FAMOTIDINE 20 MG PO TABS
20.0000 mg | ORAL_TABLET | Freq: Once | ORAL | Status: AC
  Administered 2019-12-04: 20 mg via ORAL
  Filled 2019-12-04: qty 1

## 2019-12-04 MED ORDER — SODIUM CHLORIDE 0.9% FLUSH: 3.0000 mL | Freq: Once | INTRAVENOUS | Status: DC

## 2019-12-04 MED ORDER — SUCRALFATE 1 GM/10ML PO SUSP: 1.0000 g | Freq: Three times a day (TID) | ORAL | 0 refills | Status: DC

## 2019-12-04 MED ORDER — HYDROMORPHONE HCL 1 MG/ML IJ SOLN
1.0000 mg | Freq: Once | INTRAMUSCULAR | Status: AC
  Administered 2019-12-04: 1 mg via INTRAVENOUS
  Filled 2019-12-04: qty 1

## 2019-12-04 NOTE — ED Notes (Signed)
Pt standing up at bedside   St's pain is much better at this time

## 2019-12-04 NOTE — ED Triage Notes (Signed)
Pt reports severe abd pain for the past few days, noticed some blood in his vomit this am. Pt reports hx of stomach ulcers, pt reports being seen 12 times for the same over the course of a few years.

## 2019-12-04 NOTE — ED Provider Notes (Signed)
[facility] EMERGENCY DEPARTMENT Provider Note   CSN: [id number] Arrival date & time: 12/04/19  1118     History Chief Complaint  Patient presents with  . Abdominal Pain  . Hematemesis    Ian Roy is a 49 y.o. male.  HPI Patient is a 49 year old male who presents for worsening epigastric pain over the past 4 days.  He reports that he has had this approximately 12 times in the past few years.  In the past, he has had negative work-ups and ultimately told that is likely gastritis/PUD.  He denies ever getting a endoscopic procedure.  Currently, pain started after he drank a small amount of hard iced tea 4 days ago.  Pain has been worsening.  2 days ago, he drank some excess beer and that is when the severe pain started.  He reports a small amount of hematemesis starting yesterday.  Last bowel movement was yesterday.  Patient endorses abdominal pain that is worse in the epigastrium.  He has had nausea and vomiting with a small amount of what appeared to be blood in some of his vomit.  He denies any other symptoms.    Past Medical History:  Diagnosis Date  . Alcohol use    daily  . Bronchitis   . Chronic abdominal pain   . GERD (gastroesophageal reflux disease)   . Nausea and vomiting    recurrent    Patient Active Problem List   Diagnosis Date Noted  . Family history of colonic polyps 02/18/2018  . Elevated LFTs 03/15/2016  . Emesis 11/07/2015  . Esophageal reflux 11/07/2015  . Diarrhea 11/07/2015  . Alcohol abuse 11/02/2015  . Cigarette nicotine dependence without complication [id number]    Past Surgical History:  Procedure Laterality Date  . colonoscopy    . COLONOSCOPY WITH PROPOFOL N/A 04/01/2018   Procedure: COLONOSCOPY WITH PROPOFOL;  Surgeon: Daneil Dolin, MD;  Location: [facility];  Service: Endoscopy;  Laterality: N/A;  9:15am  . None    . POLYPECTOMY  04/01/2018   Procedure: POLYPECTOMY;  Surgeon: Daneil Dolin, MD;  Location:  [facility];  Service: Endoscopy;;       Family History  Problem Relation Age of Onset  . Cancer Mother        breast  . Diabetes Mother   . Colon polyps Father   . Cancer Maternal Aunt   . Colon cancer Neg Hx     Social History   Tobacco Use  . Smoking status: Current Every Day Smoker    Packs/day: 1.00    Years: 14.00    Pack years: 14.00    Types: Cigarettes  . Smokeless tobacco: Never Used  Substance Use Topics  . Alcohol use: Yes    Alcohol/week: 6.0 standard drinks    Types: 6 Cans of beer per week    Comment: Last drank last Wed  . Drug use: No    Home Medications Prior to Admission medications   Medication Sig Start Date End Date Taking? Authorizing Provider  acetaminophen (TYLENOL) 500 MG tablet Take 500 mg by mouth every 6 (six) hours as needed for mild pain or moderate pain.    [provider]  Alum & Mag Hydroxide-Simeth (GI COCKTAIL) SUSP suspension Take 30 mLs by mouth daily. Shake well. 12/04/19   Godfrey Pick, MD  esomeprazole (NEXIUM) 40 MG capsule Take 1 capsule (40 mg total) by mouth daily. 12/02/18   Law, Bea Graff, PA-C  famotidine (PEPCID)  20 MG tablet Take 1 tablet (20 mg total) by mouth 2 (two) times daily. 12/04/19   Godfrey Pick, MD  hydrOXYzine (ATARAX/VISTARIL) 25 MG tablet Take 1 tablet (25 mg total) by mouth every 6 (six) hours as needed for anxiety. [id number]   Delora Fuel, MD  ibuprofen (ADVIL) 200 MG tablet Take 200 mg by mouth every 6 (six) hours as needed for fever or moderate pain.    [provider]  ondansetron (ZOFRAN ODT) 4 MG disintegrating tablet Take 1 tablet (4 mg total) by mouth every 8 (eight) hours as needed for nausea or vomiting. 12/04/19   Godfrey Pick, MD  sucralfate (CARAFATE) 1 GM/10ML suspension Take 10 mLs (1 g total) by mouth 4 (four) times daily -  with meals and at bedtime. 12/04/19   Godfrey Pick, MD    Allergies    Patient has no known allergies.  Review of Systems   Review of Systems    Constitutional: Positive for appetite change. Negative for chills and fever.  HENT: Negative for ear pain and sore throat.   Eyes: Negative for pain and visual disturbance.  Respiratory: Negative for cough and shortness of breath.   Cardiovascular: Negative for chest pain and palpitations.  Gastrointestinal: Positive for abdominal pain, nausea and vomiting. Negative for abdominal distention, constipation and diarrhea.  Genitourinary: Negative for dysuria, hematuria and testicular pain.  Musculoskeletal: Negative for arthralgias, back pain, myalgias and neck pain.  Skin: Negative for color change and rash.  Neurological: Negative for dizziness, seizures, syncope, weakness, light-headedness, numbness and headaches.  Hematological: Negative.   Psychiatric/Behavioral: The patient is nervous/anxious.   All other systems reviewed and are negative.   Physical Exam Updated Vital Signs BP (!) 108/98   Pulse 67   Temp 98.6 F (37 C) (Oral)   Resp 20   Ht 6' (1.829 m)   Wt 88.5 kg   SpO2 98%   BMI 26.45 kg/m   Physical Exam Vitals and nursing note reviewed.  Constitutional:      General: He is in acute distress.     Appearance: He is well-developed and normal weight. He is not ill-appearing, toxic-appearing or diaphoretic.  HENT:     Head: Normocephalic and atraumatic.     Mouth/Throat:     Mouth: Mucous membranes are moist.     Pharynx: Oropharynx is clear.  Eyes:     General: No scleral icterus.    Conjunctiva/sclera: Conjunctivae normal.  Cardiovascular:     Rate and Rhythm: Normal rate and regular rhythm.     Heart sounds: No murmur.  Pulmonary:     Effort: Pulmonary effort is normal. No respiratory distress.     Breath sounds: Normal breath sounds. No wheezing or rales.  Chest:     Chest wall: No tenderness.  Abdominal:     General: Abdomen is flat.     Palpations: Abdomen is soft.     Tenderness: There is no abdominal tenderness.     Hernia: No hernia is present.   Musculoskeletal:     Cervical back: Neck supple.  Skin:    General: Skin is warm and dry.     Capillary Refill: Capillary refill takes less than 2 seconds.  Neurological:     General: No focal deficit present.     Mental Status: He is alert and oriented to person, place, and time.  Psychiatric:        Mood and Affect: Mood is anxious.     ED Results / Procedures /  Treatments   Labs (all labs ordered are listed, but only abnormal results are displayed) Labs Reviewed  COMPREHENSIVE METABOLIC PANEL - Abnormal; Notable for the following components:      Result Value   Potassium 3.3 (*)    Chloride 91 (*)    Glucose, Bld 107 (*)    Total Protein 8.2 (*)    Anion gap 16 (*)    All other components within normal limits  CBC - Abnormal; Notable for the following components:   WBC 11.3 (*)    Platelets 402 (*)    All other components within normal limits  LIPASE, BLOOD    EKG None  Radiology DG Abdomen Acute W/Chest  Result Date: 12/04/2019 CLINICAL DATA:  Hematemesis and pain EXAM: DG ABDOMEN ACUTE W/ 1V CHEST COMPARISON:  Nov 30, 2018 abdomen series FINDINGS: PA chest: Lungs are clear. Heart size and pulmonary vascularity are normal. No adenopathy. Supine and upright abdomen: There is moderate stool in the colon. There is no bowel dilatation or air-fluid level to suggest bowel obstruction. No evident free air. No appreciable abnormal calcifications. IMPRESSION: Moderate stool in colon. No bowel obstruction or free air evident. Lungs clear. Electronically Signed   By: Lowella Grip III M.D.   On: 12/04/2019 17:09    Procedures Procedures (including critical care time)  Medications Ordered in ED Medications  HYDROmorphone (DILAUDID) injection 1 mg (1 mg Intravenous Given 12/04/19 1621)  ondansetron (ZOFRAN) injection 4 mg (4 mg Intravenous Given 12/04/19 1619)  lactated ringers bolus 1,000 mL (0 mLs Intravenous Stopped 12/04/19 1810)  alum & mag hydroxide-simeth  (MAALOX/MYLANTA) 200-200-20 MG/5ML suspension 30 mL (30 mLs Oral Given 12/04/19 1750)    And  lidocaine (XYLOCAINE) 2 % viscous mouth solution 15 mL (15 mLs Oral Given 12/04/19 1750)  famotidine (PEPCID) tablet 20 mg (20 mg Oral Given 12/04/19 1822)  potassium chloride SA (KLOR-CON) CR tablet 40 mEq (40 mEq Oral Given 12/04/19 1822)    ED Course  I have reviewed the triage vital signs and the nursing notes.  Pertinent labs & imaging results that were available during my care of the patient were reviewed by me and considered in my medical decision making (see chart for details).    MDM Rules/Calculators/A&P                      Patient is a 49 year old male who presents for worsening epigastric pain, nausea, and vomiting.  He also endorses hematemesis over the past 2 days.  He reports that he has had this in the past.  Hematemesis is described as small amount of blood mixed in with regular vomitus.  Do not suspect large-volume hematemesis.  Patient's vital signs are normal.  On exam, he is writhing around in the bed.  Abdomen is soft and without areas of focal tenderness.  On chart review, patient has been seen in the ED several times for similar symptoms, although none in the past year.  When he was having more frequent occurrences, he was supposed to follow-up with GI for endoscopy.  He was never able to.  Patient was ordered 1 mg Dilaudid, 4 mg Zofran, 1 L bolus IVF.  Acute abdomen XR series ordered.  Labs were ordered.   Labs showed slight hypokalemia (3.3), leukocytosis (11.3).  Other lab results were unremarkable.  X-ray imaging showed clear lungs, and no pneumoperitoneum.  Even absence of abdominal tenderness and normal x-ray, do not suspect perforated ulcer.  Hypokalemia and leukocytosis, likely  secondary to vomiting.  Do not suspect acute infection.  Upon reassessment, patient reported a nearly complete resolution of his pain.  He was no longer experiencing nausea.  Patient was able to  provide more history now that his pain was controlled.  He states that he used to drink heavily in the past.  Over the past year, he had cut back and no longer suffered any symptoms of epigastric pain like he had before.  Recently, he has reunited with a friend, and subsequently began drinking again.  Patient's history and exam is consistent with gastritis, likely exacerbated by alcohol use.  Other risk factors present include cigarette smoking and history of the same.  Patient was ordered GI cocktail, which she was able to tolerate without difficulty.  Potassium was also replaced.  He remained asymptomatic in the ED.  He was prescribed Pepcid and Carafate, to be taken daily, as well as a GI cocktail to be taken as needed for acute abdominal pain, and Zofran, to be taken as needed for nausea.  Patient made several requests for narcotic pain medication, but was informed that, in the event of severe abdominal pain, despite prescribed therapies, he should return to the ED rather than masking his pain with narcotics.  Patient was advised to follow-up with his PCP for continued management.  He was discharged in stable condition.  Final Clinical Impression(s) / ED Diagnoses Final diagnoses:  Epigastric pain    Rx / DC Orders ED Discharge Orders         Ordered    sucralfate (CARAFATE) 1 GM/10ML suspension  3 times daily with meals & bedtime     12/04/19 1850    ondansetron (ZOFRAN ODT) 4 MG disintegrating tablet  Every 8 hours PRN     12/04/19 1850    famotidine (PEPCID) 20 MG tablet  2 times daily     12/04/19 1850    Alum & Mag Hydroxide-Simeth (GI COCKTAIL) SUSP suspension  Daily     12/04/19 1850           Godfrey Pick, MD 12/05/19 0236    Deno Etienne, DO 12/05/19 1306

## 2019-12-04 NOTE — ED Notes (Signed)
Pt seen walking out of ED.  When approached he stated that he was going outside to see his family.  Pt was advised to go back to his room but kept walking.  When checking pt's room, pt's IV bag and tubing was on stretcher and had been removed from the hub.  Pt's belongings, bag of clothes he brought with him and black glasses were placed in pt belongings bag and placed at nurses station

## 2019-12-04 NOTE — ED Notes (Signed)
Pt to xray at this time.

## 2019-12-04 NOTE — ED Notes (Signed)
Pt st's throat is hurting from throwing up, pt requesting more pain meds for same

## 2019-12-04 NOTE — ED Notes (Signed)
Pt lying on recliner in waiting room yelling, vomiting on floor despite having emesis bag.

## 2019-12-05 ENCOUNTER — Other Ambulatory Visit: Payer: Self-pay

## 2019-12-05 ENCOUNTER — Emergency Department (HOSPITAL_COMMUNITY)
Admission: EM | Admit: 2019-12-05 | Discharge: 2019-12-06 | Disposition: A | Discharge status: Home / Self Care | Payer: Self-pay | Attending: Emergency Medicine | Admitting: Emergency Medicine

## 2019-12-05 ENCOUNTER — Encounter (HOSPITAL_COMMUNITY): Payer: Self-pay

## 2019-12-05 DIAGNOSIS — R112 Nausea with vomiting, unspecified: Secondary | ICD-10-CM | POA: Insufficient documentation

## 2019-12-05 DIAGNOSIS — F17210 Nicotine dependence, cigarettes, uncomplicated: Secondary | ICD-10-CM | POA: Insufficient documentation

## 2019-12-05 DIAGNOSIS — R1013 Epigastric pain: Secondary | ICD-10-CM | POA: Insufficient documentation

## 2019-12-05 DIAGNOSIS — K219 Gastro-esophageal reflux disease without esophagitis: Secondary | ICD-10-CM | POA: Insufficient documentation

## 2019-12-05 LAB — COMPREHENSIVE METABOLIC PANEL
ALT: 16 U/L (ref 0–44)
AST: 16 U/L (ref 15–41)
Albumin: 4.3 g/dL (ref 3.5–5.0)
Alkaline Phosphatase: 46 U/L (ref 38–126)
Anion gap: 13 (ref 5–15)
BUN: 12 mg/dL (ref 6–20)
CO2: 30 mmol/L (ref 22–32)
Calcium: 10 mg/dL (ref 8.9–10.3)
Chloride: 95 mmol/L — ABNORMAL LOW (ref 98–111)
Creatinine, Ser: 1.18 mg/dL (ref 0.61–1.24)
GFR calc Af Amer: >60 mL/min (ref >60)
GFR calc non Af Amer: >60 mL/min (ref >60)
Glucose, Bld: 104 mg/dL — ABNORMAL HIGH (ref 70–99)
Potassium: 3.8 mmol/L (ref 3.5–5.1)
Sodium: 138 mmol/L (ref 135–145)
Total Bilirubin: 0.9 mg/dL (ref 0.3–1.2)
Total Protein: 7.5 g/dL (ref 6.5–8.1)

## 2019-12-05 LAB — URINALYSIS, ROUTINE W REFLEX MICROSCOPIC
Bacteria, UA: NONE SEEN
Bilirubin Urine: NEGATIVE
Glucose, UA: NEGATIVE mg/dL
Hgb urine dipstick: NEGATIVE
Ketones, ur: 20 mg/dL — AB
Leukocytes,Ua: NEGATIVE
Nitrite: NEGATIVE
Protein, ur: 30 mg/dL — AB
Specific Gravity, Urine: 1.032 — ABNORMAL HIGH (ref 1.005–1.030)
pH: 7 (ref 5.0–8.0)

## 2019-12-05 LAB — CBC
HCT: 46.5 % (ref 39.0–52.0)
Hemoglobin: 15.8 g/dL (ref 13.0–17.0)
MCH: 30.8 pg (ref 26.0–34.0)
MCHC: 34 g/dL (ref 30.0–36.0)
MCV: 90.6 fL (ref 80.0–100.0)
Platelets: 412 10*3/uL — ABNORMAL HIGH (ref 150–400)
RBC: 5.13 MIL/uL (ref 4.22–5.81)
RDW: 13.3 % (ref 11.5–15.5)
WBC: 10.3 10*3/uL (ref 4.0–10.5)
nRBC: 0 % (ref 0.0–0.2)

## 2019-12-05 LAB — LIPASE, BLOOD: Lipase: 18 U/L (ref 11–51)

## 2019-12-05 MED ORDER — ACETAMINOPHEN 325 MG PO TABS
650.0000 mg | ORAL_TABLET | Freq: Once | ORAL | Status: AC
  Administered 2019-12-05: 650 mg via ORAL
  Filled 2019-12-05: qty 2

## 2019-12-05 MED ORDER — ALUM & MAG HYDROXIDE-SIMETH 200-200-20 MG/5ML PO SUSP
30.0000 mL | Freq: Once | ORAL | Status: AC
  Administered 2019-12-05: 30 mL via ORAL
  Filled 2019-12-05: qty 30

## 2019-12-05 MED ORDER — HYDROMORPHONE HCL 1 MG/ML IJ SOLN
1.0000 mg | Freq: Once | INTRAMUSCULAR | Status: AC
  Administered 2019-12-05: 1 mg via INTRAVENOUS
  Filled 2019-12-05: qty 1

## 2019-12-05 MED ORDER — SODIUM CHLORIDE 0.9 % IV BOLUS
1000.0000 mL | Freq: Once | INTRAVENOUS | Status: AC
  Administered 2019-12-05: 1000 mL via INTRAVENOUS

## 2019-12-05 MED ORDER — LIDOCAINE VISCOUS HCL 2 % MT SOLN
15.0000 mL | Freq: Once | OROMUCOSAL | Status: AC
  Administered 2019-12-05: 15 mL via ORAL
  Filled 2019-12-05: qty 15

## 2019-12-05 MED ORDER — ONDANSETRON HCL 4 MG/2ML IJ SOLN
4.0000 mg | Freq: Once | INTRAMUSCULAR | Status: AC
  Administered 2019-12-05: 4 mg via INTRAVENOUS
  Filled 2019-12-05: qty 2

## 2019-12-05 MED ORDER — SODIUM CHLORIDE 0.9% FLUSH: 3.0000 mL | Freq: Once | INTRAVENOUS | Status: DC

## 2019-12-05 MED ORDER — ACETAMINOPHEN 325 MG PO TABS
650.0000 mg | ORAL_TABLET | Freq: Once | ORAL | Status: AC
  Administered 2019-12-06: 650 mg via ORAL
  Filled 2019-12-05: qty 2

## 2019-12-05 NOTE — Discharge Instructions (Addendum)
As discussed, all of your labs are reassuring today.  I have included the number of a GI doctor.  Please call Monday to schedule an appointment for further evaluation.  Please fill your prescriptions from yesterday for symptomatic relief.  Return to the ER for new or worsening symptoms.

## 2019-12-05 NOTE — ED Provider Notes (Signed)
[place] EMERGENCY DEPARTMENT Provider Note   CSN: [id number] Arrival date & time: 12/05/19  1756     History Chief Complaint  Patient presents with  . Abdominal Pain    Ian Roy is a 49 y.o. male with a past medical history significant for chronic alcohol abuse, chronic abdominal pain, and GERD who presents to the ED due to 5 days of nausea and vomiting associated with epigastric pain. Patient notes he used to be a heavy drinking and recently cut back, but a few days ago began drinking twisted ice teas which he believes started his symptoms. He admits to numerous episodes of non-bilious with mild bloody emesis over the past few days. Denies fever and chills. Denies diarrhea.  Denies urinary symptoms.  He has tried over-the-counter Tylenol with no improvement.  Patient was seen in the ED yesterday for the same complaint with reassuring work-up.  Patient was discharged with Pepcid, Carafate, GI cocktail, and Zofran which he notes he has not tried yet.  Patient continuously states that his pain is caused by his "ulcer". Denies any GI follow-up. Chart reviewed and patient has been evaluated numerous times for the same complaint with reassuring work-up. Denies sick contacts and COVID exposures.  History obtained from patient and past medical records. No interpreter used during encounter.      Past Medical History:  Diagnosis Date  . Alcohol use    daily  . Bronchitis   . Chronic abdominal pain   . GERD (gastroesophageal reflux disease)   . Nausea and vomiting    recurrent    Patient Active Problem List   Diagnosis Date Noted  . Family history of colonic polyps 02/18/2018  . Elevated LFTs 03/15/2016  . Emesis 11/07/2015  . Esophageal reflux 11/07/2015  . Diarrhea 11/07/2015  . Alcohol abuse 11/02/2015  . Cigarette nicotine dependence without complication [id number]    Past Surgical History:  Procedure Laterality Date  . colonoscopy    .  COLONOSCOPY WITH PROPOFOL N/A 04/01/2018   Procedure: COLONOSCOPY WITH PROPOFOL;  Surgeon: Daneil Dolin, MD;  [facility];  Service: Endoscopy;  Laterality: N/A;  9:15am  . None    . POLYPECTOMY  04/01/2018   Procedure: POLYPECTOMY;  Surgeon: Daneil Dolin, MD;  Location: [facility];  Service: Endoscopy;;       Family History  Problem Relation Age of Onset  . Cancer Mother        breast  . Diabetes Mother   . Colon polyps Father   . Cancer Maternal Aunt   . Colon cancer Neg Hx     Social History   Tobacco Use  . Smoking status: Current Every Day Smoker    Packs/day: 1.00    Years: 14.00    Pack years: 14.00    Types: Cigarettes  . Smokeless tobacco: Never Used  Substance Use Topics  . Alcohol use: Yes    Alcohol/week: 6.0 standard drinks    Types: 6 Cans of beer per week    Comment: Last drank last Wed  . Drug use: No    Home Medications Prior to Admission medications   Medication Sig Start Date End Date Taking? Authorizing Provider  acetaminophen (TYLENOL) 500 MG tablet Take 500 mg by mouth every 6 (six) hours as needed for mild pain or moderate pain.    [provider]  Alum & Mag Hydroxide-Simeth (GI COCKTAIL) SUSP suspension Take 30 mLs by mouth daily. Shake well. 12/04/19  Godfrey Pick, MD  esomeprazole (NEXIUM) 40 MG capsule Take 1 capsule (40 mg total) by mouth daily. 12/02/18   Law, Bea Graff, PA-C  famotidine (PEPCID) 20 MG tablet Take 1 tablet (20 mg total) by mouth 2 (two) times daily. 12/04/19   Godfrey Pick, MD  hydrOXYzine (ATARAX/VISTARIL) 25 MG tablet Take 1 tablet (25 mg total) by mouth every 6 (six) hours as needed for anxiety. [id number]   Delora Fuel, MD  ibuprofen (ADVIL) 200 MG tablet Take 200 mg by mouth every 6 (six) hours as needed for fever or moderate pain.    [provider]  ondansetron (ZOFRAN ODT) 4 MG disintegrating tablet Take 1 tablet (4 mg total) by mouth every 8 (eight) hours as needed for nausea or  vomiting. 12/04/19   Godfrey Pick, MD  sucralfate (CARAFATE) 1 GM/10ML suspension Take 10 mLs (1 g total) by mouth 4 (four) times daily -  with meals and at bedtime. 12/04/19   Godfrey Pick, MD    Allergies    Patient has no known allergies.  Review of Systems   Review of Systems  Constitutional: Negative for chills and fever.  Respiratory: Negative for shortness of breath.   Cardiovascular: Negative for chest pain.  Gastrointestinal: Positive for abdominal pain, nausea and vomiting. Negative for diarrhea.  Genitourinary: Negative for dysuria.  All other systems reviewed and are negative.   Physical Exam Updated Vital Signs BP 129/73 (BP Location: Left Arm)   Pulse 72   Temp 98.6 F (37 C) (Oral)   Resp 20   SpO2 99%   Physical Exam Vitals and nursing note reviewed.  Constitutional:      General: He is not in acute distress.    Appearance: He is not toxic-appearing.     Comments: Appears to be uncomfortable in bed  HENT:     Head: Normocephalic.  Eyes:     Pupils: Pupils are equal, round, and reactive to light.  Cardiovascular:     Rate and Rhythm: Normal rate and regular rhythm.     Pulses: Normal pulses.     Heart sounds: Normal heart sounds. No murmur. No friction rub. No gallop.   Pulmonary:     Effort: Pulmonary effort is normal.     Breath sounds: Normal breath sounds.  Abdominal:     General: Abdomen is flat. Bowel sounds are normal. There is no distension.     Palpations: Abdomen is soft.     Tenderness: There is abdominal tenderness. There is no guarding or rebound.     Comments: TTP in epigastric region. No rebound or guarding.   Musculoskeletal:     Cervical back: Neck supple.     Comments: Able to move all 4 extremities without difficulty.   Skin:    General: Skin is warm and dry.  Neurological:     General: No focal deficit present.     Mental Status: He is alert.  Psychiatric:        Mood and Affect: Mood is anxious.     ED Results / Procedures /  Treatments   Labs (all labs ordered are listed, but only abnormal results are displayed) Labs Reviewed  COMPREHENSIVE METABOLIC PANEL - Abnormal; Notable for the following components:      Result Value   Chloride 95 (*)    Glucose, Bld 104 (*)    All other components within normal limits  CBC - Abnormal; Notable for the following components:   Platelets 412 (*)  All other components within normal limits  URINALYSIS, ROUTINE W REFLEX MICROSCOPIC - Abnormal; Notable for the following components:   Color, Urine AMBER (*)    APPearance HAZY (*)    Specific Gravity, Urine 1.032 (*)    Ketones, ur 20 (*)    Protein, ur 30 (*)    All other components within normal limits  LIPASE, BLOOD    EKG None  Radiology DG Abdomen Acute W/Chest  Result Date: 12/04/2019 CLINICAL DATA:  Hematemesis and pain EXAM: DG ABDOMEN ACUTE W/ 1V CHEST COMPARISON:  Nov 30, 2018 abdomen series FINDINGS: PA chest: Lungs are clear. Heart size and pulmonary vascularity are normal. No adenopathy. Supine and upright abdomen: There is moderate stool in the colon. There is no bowel dilatation or air-fluid level to suggest bowel obstruction. No evident free air. No appreciable abnormal calcifications. IMPRESSION: Moderate stool in colon. No bowel obstruction or free air evident. Lungs clear. Electronically Signed   By: Lowella Grip III M.D.   On: 12/04/2019 17:09    Procedures Procedures (including critical care time)  Medications Ordered in ED Medications  sodium chloride flush (NS) 0.9 % injection 3 mL (has no administration in time range)  acetaminophen (TYLENOL) tablet 650 mg (650 mg Oral Given 12/05/19 1924)  HYDROmorphone (DILAUDID) injection 1 mg (1 mg Intravenous Given 12/05/19 2111)  sodium chloride 0.9 % bolus 1,000 mL (1,000 mLs Intravenous New Bag/Given 12/05/19 2113)  ondansetron (ZOFRAN) injection 4 mg (4 mg Intravenous Given 12/05/19 2112)  alum & mag hydroxide-simeth (MAALOX/MYLANTA) 200-200-20  MG/5ML suspension 30 mL (30 mLs Oral Given 12/05/19 2212)    And  lidocaine (XYLOCAINE) 2 % viscous mouth solution 15 mL (15 mLs Oral Given 12/05/19 2212)    ED Course  I have reviewed the triage vital signs and the nursing notes.  Pertinent labs & imaging results that were available during my care of the patient were reviewed by me and considered in my medical decision making (see chart for details).  Clinical Course as of Dec 05 2243  Fri Dec 05, 2019  2146 Lipase: 18 [CA]  2146 WBC: 10.3 [CA]    Clinical Course User Index [CA] Suzy Bouchard, PA-C   MDM Rules/Calculators/A&P                     49 year old male presents to the ED due to acute on chronic epigastric pain associated with nausea and vomiting.  Patient was evaluated in the ED yesterday for the same complaint with reassuring work-up and has had numerous ED visits for the same.  On arrival, vitals all within normal limits.  Patient is afebrile, not tachycardic or hypoxic.  Patient in no acute distress and nontoxic-appearing.  Physical exam significant for mild epigastric tenderness without rebound or guarding. Abdominal exam is inconsistent when distracted.  No peritoneal signs.  Low suspicion for acute abdomen at this time.  Will obtain routine abdominal labs and UA.  IV Zofran, Dilaudid, fluids given for symptomatic relief. Suspect symptoms related to possible alcoholic gastritis given his increase in alcohol intake over the past few days. Patient does admit to some streaks of blood in his emesis, but low suspicion for large-volume hematemesis.   CBC reassuring with no leukocytosis.  Lipase normal at 18.  Doubt pancreatitis.  CMP reassuring with normal renal function no major electrolyte derangements.  UA significant for ketone urea and proteinuria, but no signs of infection.  10:20 PM reassessed patient at bedside and he was very  uncooperative pleading for me to send him home with pain medication for his chronic abdominal  pain. Patient able to tolerate GI cocktail without any difficulty. I expressed to patient the need to follow-up with GI for further evaluation and he became very angry requesting pain pills. I talked at length with patient about how he was prescribed numerous medications last night for his abdominal pain, nausea, and vomiting which he has not filled yet, but could alleviate his symptoms.   Patient's pain has improved and has had no more episodes of emesis after his medication.  Patient is stable for discharge.  Will discharge patient with GI referral.  Advised patient to have his prescriptions from yesterday filled for symptomatic relief. Strict ED precautions discussed with patient. Patient states understanding and agrees to plan. Patient discharged home in no acute distress and stable vitals.  Final Clinical Impression(s) / ED Diagnoses Final diagnoses:  Epigastric abdominal pain  Non-intractable vomiting with nausea, unspecified vomiting type    Rx / DC Orders ED Discharge Orders    None       Karie Kirks 12/05/19 2248    Malvin Johns, MD 12/05/19 2307

## 2019-12-05 NOTE — ED Triage Notes (Signed)
Pt reports that he has abd pain and n/v, seen several times for the same, reports that he has been drinking heavy lately and vomited blood today. Pt states that he is just here for pain pills.

## 2020-05-04 ENCOUNTER — Emergency Department (HOSPITAL_COMMUNITY): Payer: Self-pay

## 2020-05-04 ENCOUNTER — Emergency Department (HOSPITAL_COMMUNITY)
Admission: EM | Admit: 2020-05-04 | Discharge: 2020-05-05 | Disposition: A | Discharge status: Home / Self Care | Payer: Self-pay | Attending: Emergency Medicine | Admitting: Emergency Medicine

## 2020-05-04 ENCOUNTER — Encounter (HOSPITAL_COMMUNITY): Payer: Self-pay | Admitting: Emergency Medicine

## 2020-05-04 ENCOUNTER — Other Ambulatory Visit: Payer: Self-pay

## 2020-05-04 DIAGNOSIS — K219 Gastro-esophageal reflux disease without esophagitis: Secondary | ICD-10-CM | POA: Insufficient documentation

## 2020-05-04 DIAGNOSIS — R112 Nausea with vomiting, unspecified: Secondary | ICD-10-CM

## 2020-05-04 DIAGNOSIS — F17210 Nicotine dependence, cigarettes, uncomplicated: Secondary | ICD-10-CM | POA: Insufficient documentation

## 2020-05-04 DIAGNOSIS — R1012 Left upper quadrant pain: Secondary | ICD-10-CM

## 2020-05-04 LAB — CBC
HCT: 45.7 % (ref 39.0–52.0)
Hemoglobin: 15.4 g/dL (ref 13.0–17.0)
MCH: 31.6 pg (ref 26.0–34.0)
MCHC: 33.7 g/dL (ref 30.0–36.0)
MCV: 93.6 fL (ref 80.0–100.0)
Platelets: 413 10*3/uL — ABNORMAL HIGH (ref 150–400)
RBC: 4.88 MIL/uL (ref 4.22–5.81)
RDW: 13.5 % (ref 11.5–15.5)
WBC: 11.9 10*3/uL — ABNORMAL HIGH (ref 4.0–10.5)
nRBC: 0 % (ref 0.0–0.2)

## 2020-05-04 LAB — COMPREHENSIVE METABOLIC PANEL
ALT: 20 U/L (ref 0–44)
AST: 22 U/L (ref 15–41)
Albumin: 4 g/dL (ref 3.5–5.0)
Alkaline Phosphatase: 45 U/L (ref 38–126)
Anion gap: 13 (ref 5–15)
BUN: 11 mg/dL (ref 6–20)
CO2: 26 mmol/L (ref 22–32)
Calcium: 9.3 mg/dL (ref 8.9–10.3)
Chloride: 93 mmol/L — ABNORMAL LOW (ref 98–111)
Creatinine, Ser: 0.98 mg/dL (ref 0.61–1.24)
GFR, Estimated: >60 mL/min (ref >60)
Glucose, Bld: 108 mg/dL — ABNORMAL HIGH (ref 70–99)
Potassium: 3 mmol/L — ABNORMAL LOW (ref 3.5–5.1)
Sodium: 132 mmol/L — ABNORMAL LOW (ref 135–145)
Total Bilirubin: 0.3 mg/dL (ref 0.3–1.2)
Total Protein: 7.1 g/dL (ref 6.5–8.1)

## 2020-05-04 LAB — URINALYSIS, ROUTINE W REFLEX MICROSCOPIC
Bacteria, UA: NONE SEEN
Bilirubin Urine: NEGATIVE
Glucose, UA: NEGATIVE mg/dL
Hgb urine dipstick: NEGATIVE
Ketones, ur: 5 mg/dL — AB
Nitrite: NEGATIVE
Protein, ur: 100 mg/dL — AB
Specific Gravity, Urine: 1.029 (ref 1.005–1.030)
pH: 6 (ref 5.0–8.0)

## 2020-05-04 LAB — LIPASE, BLOOD: Lipase: 46 U/L (ref 11–51)

## 2020-05-04 MED ORDER — ONDANSETRON HCL 4 MG/2ML IJ SOLN: 4.0000 mg | Freq: Once | INTRAMUSCULAR | Status: DC

## 2020-05-04 MED ORDER — PANTOPRAZOLE SODIUM 20 MG PO TBEC
20.0000 mg | DELAYED_RELEASE_TABLET | Freq: Every day | ORAL | 0 refills | Status: DC
Start: 2020-05-04 — End: 2021-05-30

## 2020-05-04 MED ORDER — HALOPERIDOL LACTATE 5 MG/ML IJ SOLN
2.0000 mg | Freq: Once | INTRAMUSCULAR | Status: AC
  Administered 2020-05-04: 2 mg via INTRAVENOUS
  Filled 2020-05-04: qty 1

## 2020-05-04 MED ORDER — METOCLOPRAMIDE HCL 5 MG/ML IJ SOLN
10.0000 mg | Freq: Once | INTRAMUSCULAR | Status: AC
  Administered 2020-05-04: 10 mg via INTRAVENOUS
  Filled 2020-05-04: qty 2

## 2020-05-04 MED ORDER — LIDOCAINE VISCOUS HCL 2 % MT SOLN
15.0000 mL | Freq: Once | OROMUCOSAL | Status: AC
  Administered 2020-05-04: 15 mL via ORAL
  Filled 2020-05-04: qty 15

## 2020-05-04 MED ORDER — ONDANSETRON HCL 4 MG/2ML IJ SOLN
4.0000 mg | Freq: Once | INTRAMUSCULAR | Status: AC
  Administered 2020-05-04: 4 mg via INTRAVENOUS
  Filled 2020-05-04: qty 2

## 2020-05-04 MED ORDER — POTASSIUM CHLORIDE ER 10 MEQ PO TBCR: 20.0000 meq | EXTENDED_RELEASE_TABLET | Freq: Every day | ORAL | 0 refills | Status: DC

## 2020-05-04 MED ORDER — ALUM & MAG HYDROXIDE-SIMETH 200-200-20 MG/5ML PO SUSP
30.0000 mL | Freq: Once | ORAL | Status: AC
  Administered 2020-05-04: 30 mL via ORAL
  Filled 2020-05-04: qty 30

## 2020-05-04 MED ORDER — FENTANYL CITRATE (PF) 100 MCG/2ML IJ SOLN
100.0000 ug | Freq: Once | INTRAMUSCULAR | Status: AC
  Administered 2020-05-04: 100 ug via INTRAVENOUS
  Filled 2020-05-04: qty 2

## 2020-05-04 MED ORDER — POTASSIUM CHLORIDE CRYS ER 20 MEQ PO TBCR
40.0000 meq | EXTENDED_RELEASE_TABLET | Freq: Once | ORAL | Status: AC
  Administered 2020-05-04: 40 meq via ORAL
  Filled 2020-05-04: qty 2

## 2020-05-04 MED ORDER — DIPHENHYDRAMINE HCL 50 MG/ML IJ SOLN
12.5000 mg | Freq: Once | INTRAMUSCULAR | Status: AC
  Administered 2020-05-04: 12.5 mg via INTRAVENOUS
  Filled 2020-05-04: qty 1

## 2020-05-04 MED ORDER — PANTOPRAZOLE SODIUM 40 MG PO TBEC
40.0000 mg | DELAYED_RELEASE_TABLET | Freq: Once | ORAL | Status: AC
  Administered 2020-05-04: 40 mg via ORAL
  Filled 2020-05-04: qty 1

## 2020-05-04 MED ORDER — SODIUM CHLORIDE 0.9 % IV BOLUS
1000.0000 mL | Freq: Once | INTRAVENOUS | Status: AC
  Administered 2020-05-04: 1000 mL via INTRAVENOUS

## 2020-05-04 NOTE — ED Notes (Addendum)
Patient screaming at nurse and tech. Patient throwing things in room floor and thrashing in bed. Patient refuses to let nurse explain what medications are being used and what for and stated that he just wants more pain medication.

## 2020-05-04 NOTE — Discharge Instructions (Addendum)
Mr. Bognar, you came to the ED for abdominal pain.  Pain is better with Maalox.  Please start taking Protonix 20 mg daily.  Please also take potassium 20 mEq for 4 days.  I sent a referral to [facility], please give them a call if you do not hear back from them in a week.  Please also call [facility] to schedule a appointment for primary care physician.  Come back to the ED for worsening vomiting blood or abdominal pain.  Take care

## 2020-05-04 NOTE — ED Provider Notes (Signed)
  Physical Exam  BP 116/68 (BP Location: Left Arm)   Pulse 69   Temp 98.4 F (36.9 C) (Oral)   Resp 19   Ht 5\' 9"  (1.753 m)   Wt 95 kg   SpO2 100%   BMI 30.93 kg/m   Physical Exam  ED Course/Procedures     Procedures  MDM  Signed off received from [facility] [place].  CBC shows mild leukocytosis of 11.9.  CMP shows hypokalemia of 3.  Lipase is within normal limits, low suspicion for pancreatitis.  UA does not suggest UTI.  His abdominal pain is likely alcoholic gastritis versus PUD.  Patient was scheduled for an upper endoscopy but was canceled due to intoxication.  When speaking with patient, he is in fetal position and complains of severe abdominal pain.  No emesis episode observed.  Patient is willing to try p.o. medications.  On reassessment, patient tolerated p.o. well with Maalox and potassium.  He states that pain is better and only 5/10.  Right upper quadrant ultrasound was negative for any abnormality.  Patient will be discharged with Protonix and potassium supplement for his hypokalemia.  Long discussion about the importance of having a primary care physician and follow-up with the GI physician for an upper endoscopy.  Ambulatory referral to [place].  Patient is encouraged to make an appointment with [facility] for a primary care physician.  Patient is stable during the discussion with no more episode of emesis.  He is stable for discharge.  Strict return precaution for worsening hematemesis or abdominal pain.  Patient agrees with the plan.       Gaylan Gerold, DO 05/04/20 2357    Gareth Morgan, MD 05/06/20 [phone]

## 2020-05-04 NOTE — ED Triage Notes (Signed)
Patient reports LUQ abdominal pain " stomach ulcer" with emesis this evening , denies diarrhea or fever .

## 2020-05-04 NOTE — ED Provider Notes (Signed)
[facility] EMERGENCY DEPARTMENT Provider Note   CSN: [id number] Arrival date & time: 05/04/20  1907     History Chief Complaint  Patient presents with  . Abdominal Pain    Ian Roy is a 49 y.o. male with pertinent past medical history of alcohol use, chronic abdominal pain, GERD  that presents emerge department today for left upper quadrant pain.  Pain began Sunday night, however worsened this morning.  Patient states that he has had this left upper quadrant pain for about 3 years, frequently comes to the emergency department for pain management.  Per chart review history, has come to the emerge department for left upper quadrant pain multiple times, last time in May.  Has gotten 2 CTs, has also gotten plain films of abdomen which have been negative.  Patient was supposed to see Dr. Gala Romney, GI for endoscopy, however was unable to complete procedure since patient was not sober.  Patient states that he drinks alcohol daily, normally gets beer.  States that he drinks the apple cider beer this morning which made his pain become worse.  Patient also vomiting, clear spit.  No hematemesis.  No diarrhea, fevers, chills, back pain.  Abdominal pain does not radiate anywhere.  Patient describes abdominal pain as a gnawing boring pain that is in his left upper quadrant.  Not take anything for this.  Denies any sick contacts.  No shortness of breath, chest pain.  No other complaints.  HPI     Past Medical History:  Diagnosis Date  . Alcohol use    daily  . Bronchitis   . Chronic abdominal pain   . GERD (gastroesophageal reflux disease)   . Nausea and vomiting    recurrent    Patient Active Problem List   Diagnosis Date Noted  . Family history of colonic polyps 02/18/2018  . Elevated LFTs 03/15/2016  . Emesis 11/07/2015  . Esophageal reflux 11/07/2015  . Diarrhea 11/07/2015  . Alcohol abuse 11/02/2015  . Cigarette nicotine dependence without complication  24/58/0998    Past Surgical History:  Procedure Laterality Date  . colonoscopy    . COLONOSCOPY WITH PROPOFOL N/A 04/01/2018   Procedure: COLONOSCOPY WITH PROPOFOL;  Surgeon: Daneil Dolin, MD;  [facility];  Service: Endoscopy;  Laterality: N/A;  9:15am  . None    . POLYPECTOMY  04/01/2018   Procedure: POLYPECTOMY;  Surgeon: Daneil Dolin, MD;  [facility]: [facility];  Service: Endoscopy;;       Family History  Problem Relation Age of Onset  . Cancer Mother        breast  . Diabetes Mother   . Colon polyps Father   . Cancer Maternal Aunt   . Colon cancer Neg Hx     Social History   Tobacco Use  . Smoking status: Current Every Day Smoker    Packs/day: 1.00    Years: 14.00    Pack years: 14.00    Types: Cigarettes  . Smokeless tobacco: Never Used  Vaping Use  . Vaping Use: Never used  Substance Use Topics  . Alcohol use: Yes    Alcohol/week: 6.0 standard drinks    Types: 6 Cans of beer per week    Comment: Last drank last Wed  . Drug use: No    Home Medications Prior to Admission medications   Medication Sig Start Date End Date Taking? Authorizing Provider  acetaminophen (TYLENOL) 500 MG tablet Take 500 mg by mouth every 6 (  six) hours as needed for mild pain or moderate pain.    [provider]  Alum & Mag Hydroxide-Simeth (GI COCKTAIL) SUSP suspension Take 30 mLs by mouth daily. Shake well. 12/04/19   Godfrey Pick, MD  esomeprazole (NEXIUM) 40 MG capsule Take 1 capsule (40 mg total) by mouth daily. 12/02/18   Law, Bea Graff, PA-C  famotidine (PEPCID) 20 MG tablet Take 1 tablet (20 mg total) by mouth 2 (two) times daily. 12/04/19   Godfrey Pick, MD  hydrOXYzine (ATARAX/VISTARIL) 25 MG tablet Take 1 tablet (25 mg total) by mouth every 6 (six) hours as needed for anxiety. 6/73/41   Delora Fuel, MD  ibuprofen (ADVIL) 200 MG tablet Take 200 mg by mouth every 6 (six) hours as needed for fever or moderate pain.    [provider]    ondansetron (ZOFRAN ODT) 4 MG disintegrating tablet Take 1 tablet (4 mg total) by mouth every 8 (eight) hours as needed for nausea or vomiting. 12/04/19   Godfrey Pick, MD  sucralfate (CARAFATE) 1 GM/10ML suspension Take 10 mLs (1 g total) by mouth 4 (four) times daily -  with meals and at bedtime. 12/04/19   Godfrey Pick, MD    Allergies    Patient has no known allergies.  Review of Systems   Review of Systems  Constitutional: Negative for chills, diaphoresis, fatigue and fever.  HENT: Negative for congestion, sore throat and trouble swallowing.   Eyes: Negative for pain and visual disturbance.  Respiratory: Negative for cough, shortness of breath and wheezing.   Cardiovascular: Negative for chest pain, palpitations and leg swelling.  Gastrointestinal: Positive for abdominal pain. Negative for abdominal distention, diarrhea, nausea and vomiting.  Genitourinary: Negative for difficulty urinating.  Musculoskeletal: Negative for back pain, neck pain and neck stiffness.  Skin: Negative for pallor.  Neurological: Negative for dizziness, speech difficulty, weakness and headaches.  Psychiatric/Behavioral: Negative for confusion.    Physical Exam Updated Vital Signs BP (!) 146/85   Pulse 97   Temp 98.4 F (36.9 C) (Oral)   Resp 20   Ht 5\' 9"  (1.753 m)   Wt 95 kg   SpO2 99%   BMI 30.93 kg/m   Physical Exam Constitutional:      General: He is not in acute distress.    Appearance: Normal appearance. He is not ill-appearing, toxic-appearing or diaphoretic.     Comments: Patient hysterically crying in exam, in fetal position.  Is gagging, clear spit in emesis bag.  HENT:     Mouth/Throat:     Mouth: Mucous membranes are moist.     Pharynx: Oropharynx is clear.  Eyes:     General: No scleral icterus.    Extraocular Movements: Extraocular movements intact.     Pupils: Pupils are equal, round, and reactive to light.  Cardiovascular:     Rate and Rhythm: Normal rate and regular rhythm.      Pulses: Normal pulses.     Heart sounds: Normal heart sounds.  Pulmonary:     Effort: Pulmonary effort is normal. No respiratory distress.     Breath sounds: Normal breath sounds. No stridor. No wheezing, rhonchi or rales.  Chest:     Chest wall: No tenderness.  Abdominal:     General: Abdomen is flat. Bowel sounds are normal. There is no distension.     Palpations: Abdomen is soft.     Tenderness: There is no abdominal tenderness. There is no right CVA tenderness, left CVA tenderness, guarding or rebound.  Negative signs include Murphy's sign, Rovsing's sign, McBurney's sign, psoas sign and obturator sign.  Musculoskeletal:        General: No swelling or tenderness. Normal range of motion.     Cervical back: Normal range of motion and neck supple. No rigidity.     Right lower leg: No edema.     Left lower leg: No edema.  Skin:    General: Skin is warm and dry.     Capillary Refill: Capillary refill takes less than 2 seconds.     Coloration: Skin is not pale.  Neurological:     General: No focal deficit present.     Mental Status: He is alert and oriented to person, place, and time.  Psychiatric:        Mood and Affect: Mood normal.        Behavior: Behavior normal.     ED Results / Procedures / Treatments   Labs (all labs ordered are listed, but only abnormal results are displayed) Labs Reviewed  COMPREHENSIVE METABOLIC PANEL - Abnormal; Notable for the following components:      Result Value   Sodium 132 (*)    Potassium 3.0 (*)    Chloride 93 (*)    Glucose, Bld 108 (*)    All other components within normal limits  CBC - Abnormal; Notable for the following components:   WBC 11.9 (*)    Platelets 413 (*)    All other components within normal limits  URINALYSIS, ROUTINE W REFLEX MICROSCOPIC - Abnormal; Notable for the following components:   APPearance HAZY (*)    Ketones, ur 5 (*)    Protein, ur 100 (*)    Leukocytes,Ua TRACE (*)    All other components within  normal limits  URINE CULTURE  LIPASE, BLOOD    EKG None  Radiology No results found.  Procedures Procedures (including critical care time)  Medications Ordered in ED Medications  ondansetron (ZOFRAN) injection 4 mg (4 mg Intravenous Given 05/04/20 2030)  fentaNYL (SUBLIMAZE) injection 100 mcg (100 mcg Intravenous Given 05/04/20 2030)  sodium chloride 0.9 % bolus 1,000 mL (1,000 mLs Intravenous New Bag/Given 05/04/20 2030)  metoCLOPramide (REGLAN) injection 10 mg (10 mg Intravenous Given 05/04/20 2149)  diphenhydrAMINE (BENADRYL) injection 12.5 mg (12.5 mg Intravenous Given 05/04/20 2149)  haloperidol lactate (HALDOL) injection 2 mg (2 mg Intravenous Given 05/04/20 2207)    ED Course  I have reviewed the triage vital signs and the nursing notes.  Pertinent labs & imaging results that were available during my care of the patient were reviewed by me and considered in my medical decision making (see chart for details).    MDM Rules/Calculators/A&P                         Ian Roy is a 49 y.o. male with pertinent past medical history of alcohol use, chronic abdominal pain, GERD  that presents emerge department today for left upper quadrant pain.Patient without any tenderness on abdominal exam. Differential to include gastric ulcer, gastroparesis, alcoholic gastritis, cyclical vomiting.   Work-up today unremarkable. Leukocytosis of 11.9, normal lipase. CMP remarkable for sodium of 132, potassium of 3. Will give 1 L of fluids at this time. When patient was seen in May he was discharged with GI cocktail and GI follow-up, however when he saw GI doctor for endoscopy he was not sober therefore they did not proceed with the procedure. Have tried giving Zofran and Reglan  without much relief. Fentanyl given for pain, however patient still stating that pain is a 10/10. Nursing continuously telling me that patient is thrashing around the room and being rude to staff, will try Haldol at  this time.  Pt care was handed off to Resident  Dr. Alfonse Spruce  Complete history and physical and current plan have been communicated.  Please refer to their note for the remainder of ED care and ultimate disposition. Plan to get nausea and pain under control, awaiting ultrasound. Will discharge home with repeat GI referral, patient definitely needs endoscopy however not emergently. We will also discharge home with Protonix and potassium. Will p.o. challenge with potassium.    Final Clinical Impression(s) / ED Diagnoses Final diagnoses:  Intractable nausea and vomiting    Rx / DC Orders ED Discharge Orders    None       Alfredia Client, PA-C 05/04/20 2219    Gareth Morgan, MD 05/06/20 1551

## 2020-05-06 LAB — URINE CULTURE: Culture: NO GROWTH

## 2020-06-28 IMAGING — DX DG ABDOMEN ACUTE W/ 1V CHEST
4 series · 4 of 4 positions shown · non-contrast
Comparison: November 30, 2018 abdomen series

CLINICAL DATA: Hematemesis and pain

EXAM:
DG ABDOMEN ACUTE W/ 1V CHEST

[abdomen erect]
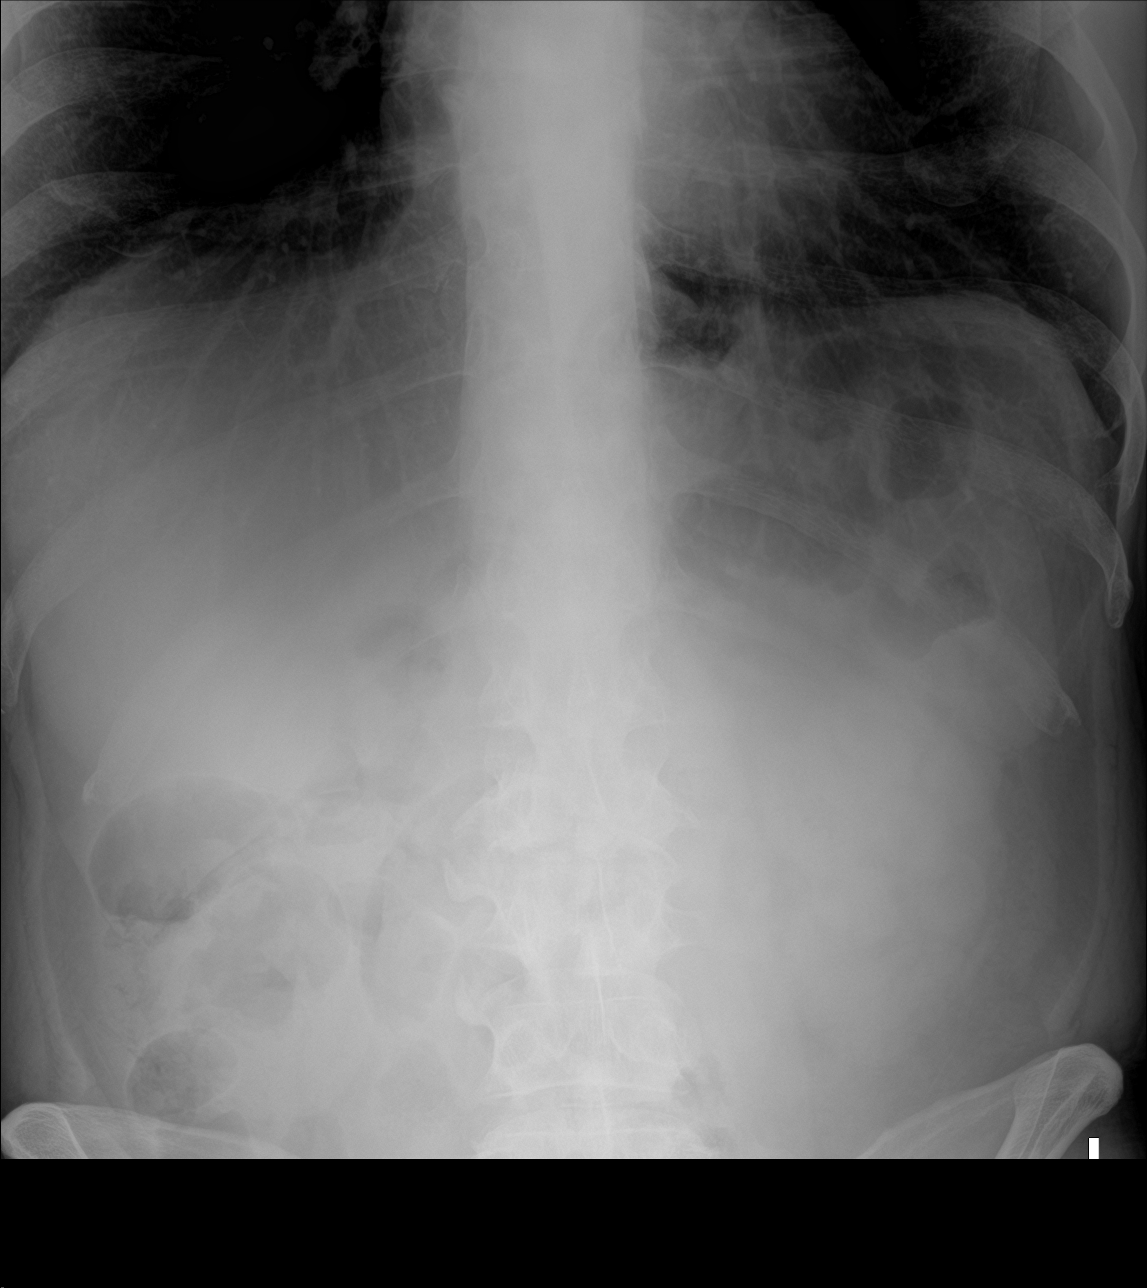

[abdomen supine]
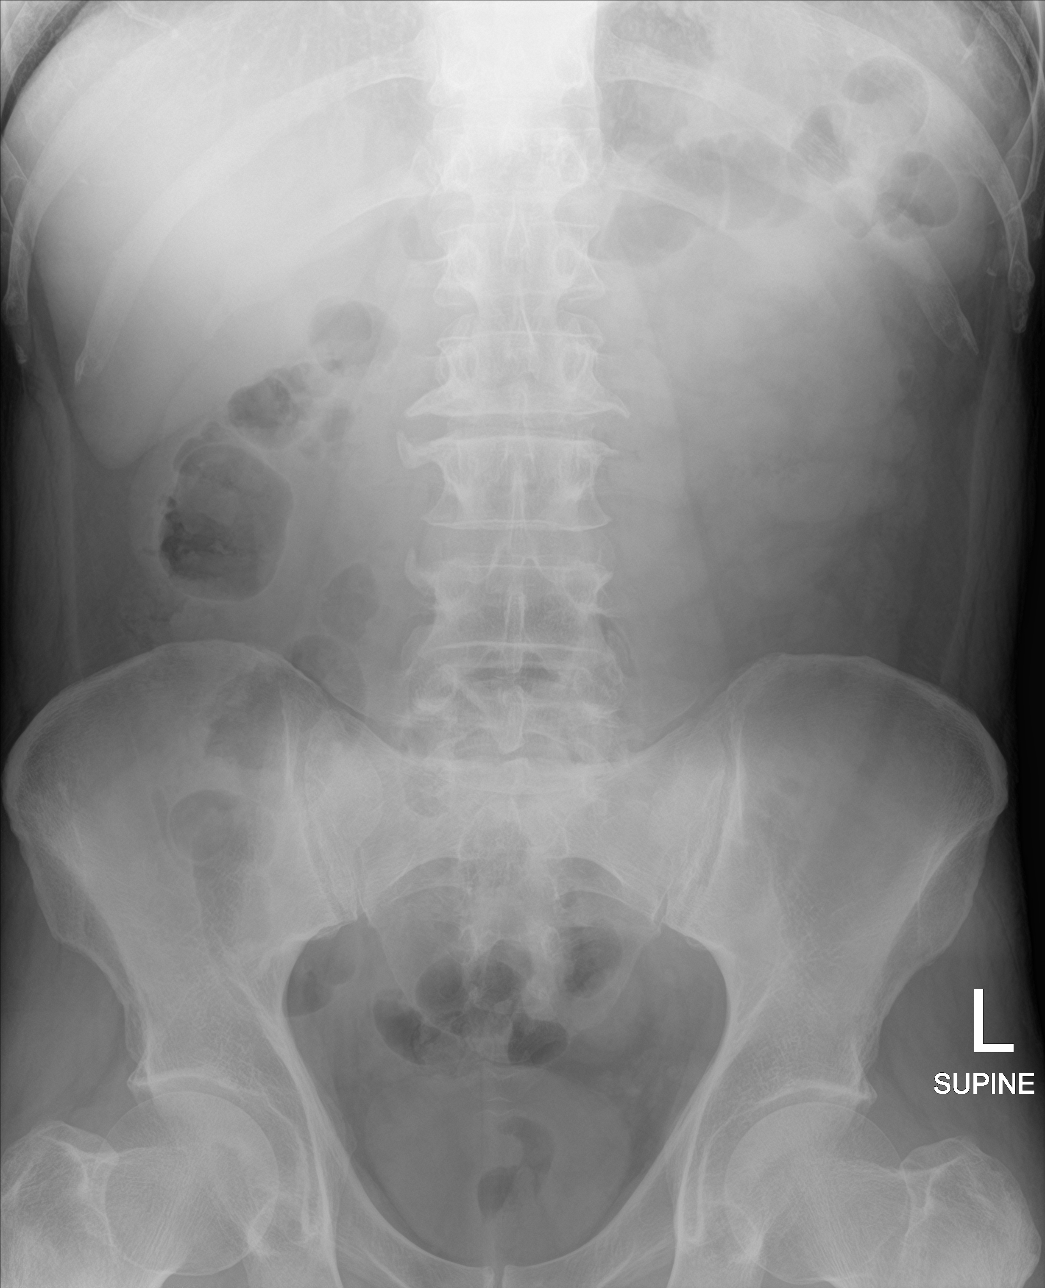

[chest ap (1 of 2)]
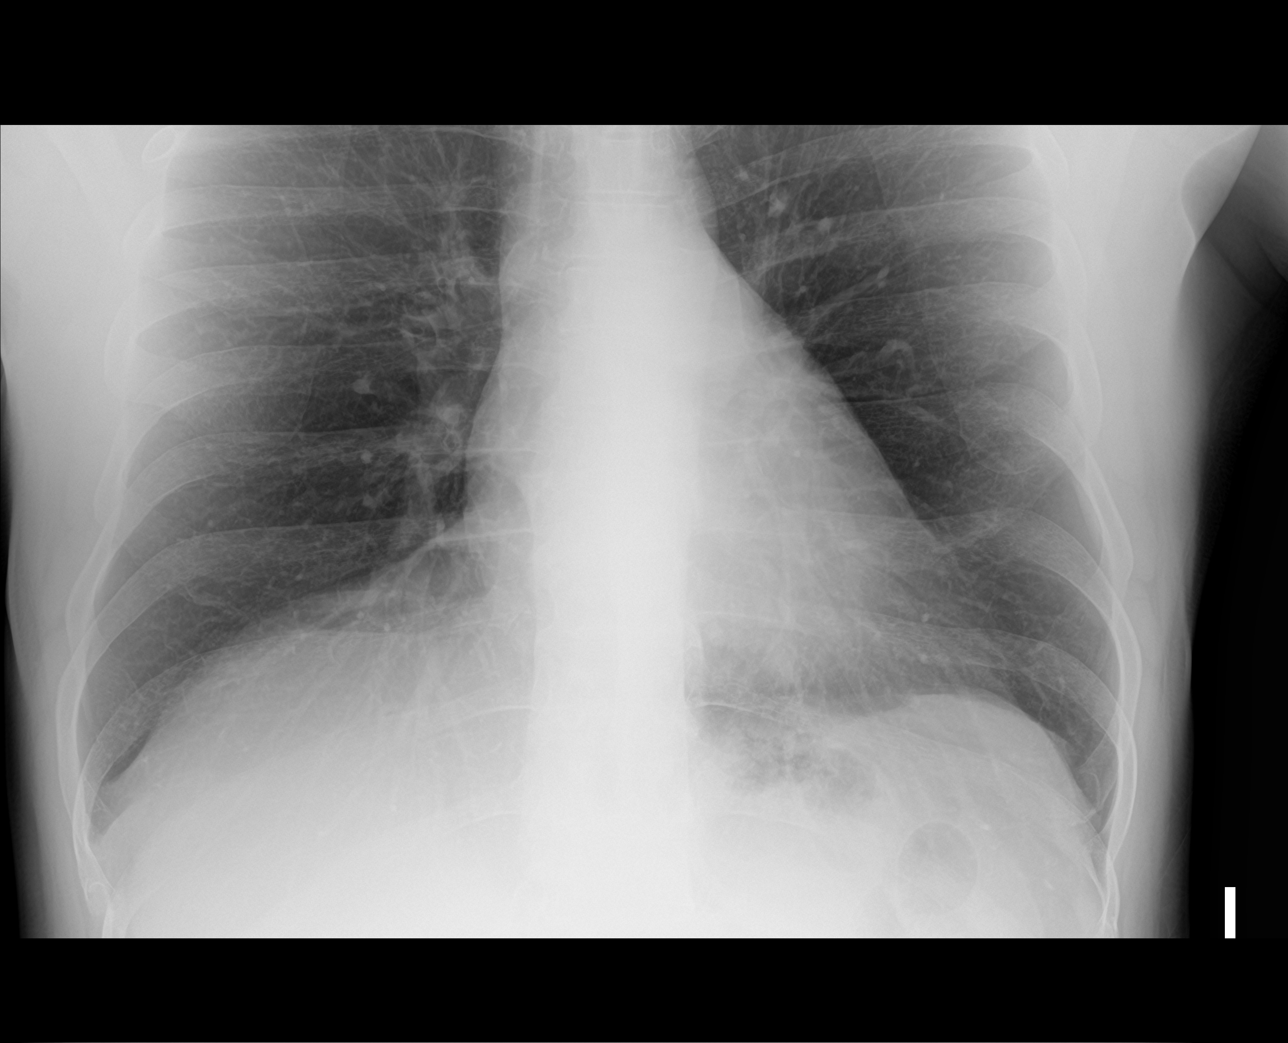

[chest ap (2 of 2)]
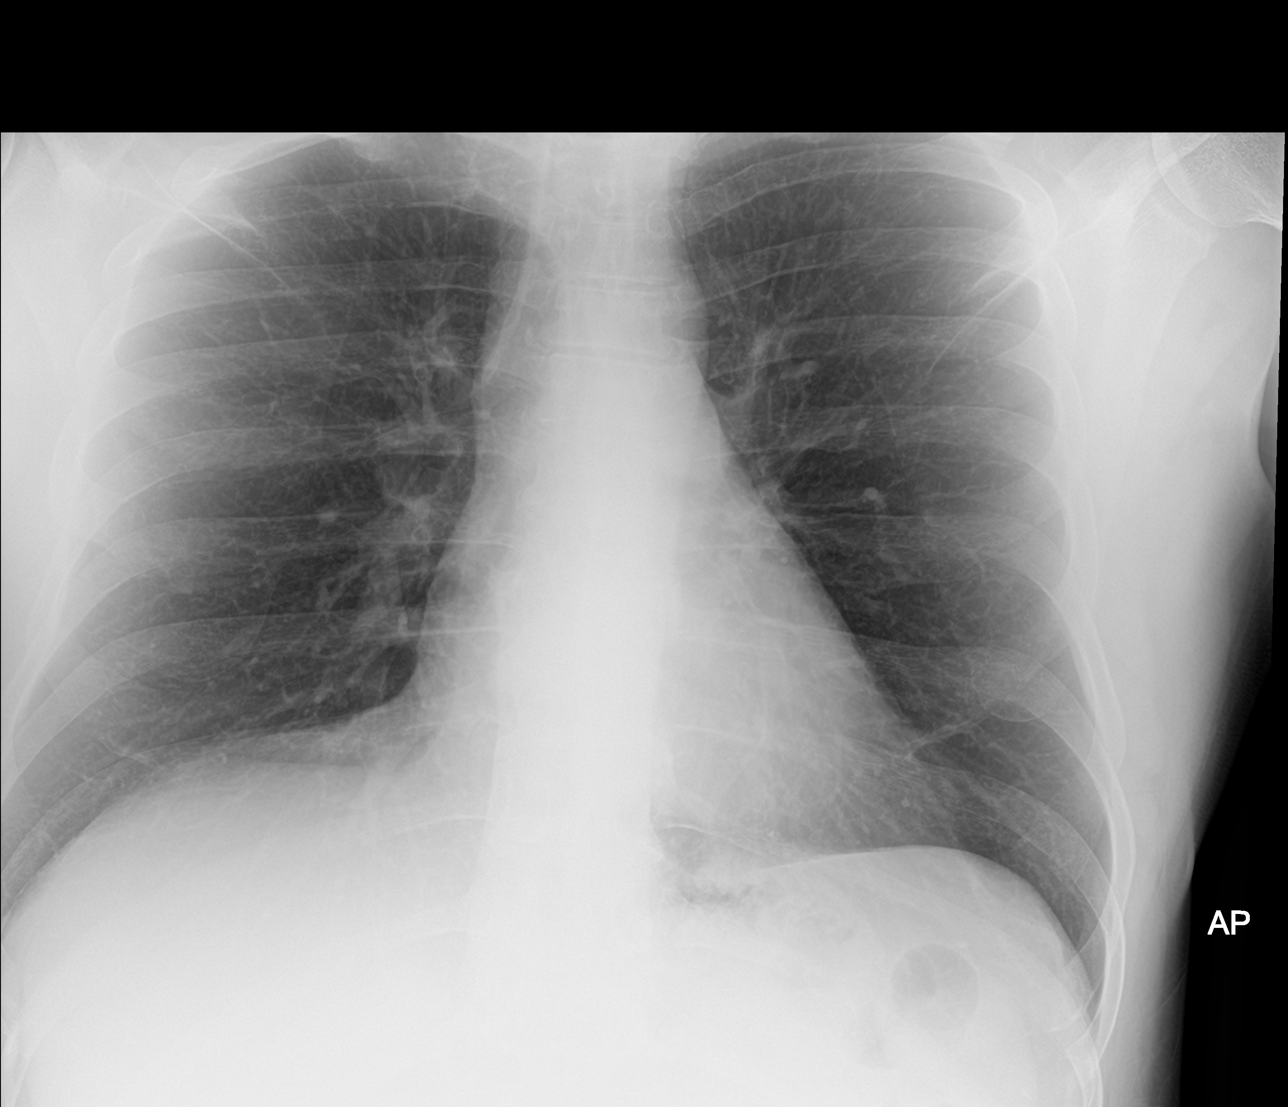

[4 of 4 positions shown; findings below may reference images not displayed]

FINDINGS: PA chest: Lungs are clear. Heart size and pulmonary vascularity are
normal. No adenopathy.

Supine and upright abdomen: There is moderate stool in the colon.
There is no bowel dilatation or air-fluid level to suggest bowel
obstruction. No evident free air. No appreciable abnormal
calcifications.
IMPRESSION: Moderate stool in colon. No bowel obstruction or free air evident.
Lungs clear.

## 2021-03-07 ENCOUNTER — Ambulatory Visit: Payer: Self-pay | Admitting: Physician Assistant

## 2021-03-08 ENCOUNTER — Telehealth: Payer: Self-pay

## 2021-03-08 NOTE — Telephone Encounter (Signed)
Called client to follow up , he was scheduled to re-establish primary care with [facility] and missed appointment. Client had transportation arranged that came per client. Discussed with client to call [facility] to reschedule appointment and then let [facility] staff know when if he needs transportation.  Client reported understanding.  Grand[employer]

## 2021-05-30 ENCOUNTER — Other Ambulatory Visit: Payer: Self-pay

## 2021-05-30 ENCOUNTER — Ambulatory Visit: Payer: Self-pay | Admitting: Physician Assistant

## 2021-05-30 ENCOUNTER — Other Ambulatory Visit (HOSPITAL_COMMUNITY)
Admission: RE | Admit: 2021-05-30 | Discharge: 2021-05-30 | Disposition: A | Discharge status: Home / Self Care | Payer: Self-pay | Source: Ambulatory Visit | Attending: Physician Assistant | Admitting: Physician Assistant

## 2021-05-30 ENCOUNTER — Encounter: Payer: Self-pay | Admitting: Physician Assistant

## 2021-05-30 VITALS — BP 99/73 | HR 74 | Temp 97.8°F | Ht 71.0 in | Wt 201.0 lb

## 2021-05-30 DIAGNOSIS — B353 Tinea pedis: Secondary | ICD-10-CM

## 2021-05-30 DIAGNOSIS — E876 Hypokalemia: Secondary | ICD-10-CM | POA: Insufficient documentation

## 2021-05-30 DIAGNOSIS — M778 Other enthesopathies, not elsewhere classified: Secondary | ICD-10-CM

## 2021-05-30 DIAGNOSIS — Z125 Encounter for screening for malignant neoplasm of prostate: Secondary | ICD-10-CM

## 2021-05-30 DIAGNOSIS — Z7689 Persons encountering health services in other specified circumstances: Secondary | ICD-10-CM

## 2021-05-30 DIAGNOSIS — F17200 Nicotine dependence, unspecified, uncomplicated: Secondary | ICD-10-CM

## 2021-05-30 LAB — BASIC METABOLIC PANEL
Anion gap: 5 (ref 5–15)
BUN: 18 mg/dL (ref 6–20)
CO2: 29 mmol/L (ref 22–32)
Calcium: 9.6 mg/dL (ref 8.9–10.3)
Chloride: 102 mmol/L (ref 98–111)
Creatinine, Ser: 0.94 mg/dL (ref 0.61–1.24)
GFR, Estimated: >60 mL/min (ref >60)
Glucose, Bld: 96 mg/dL (ref 70–99)
Potassium: 5 mmol/L (ref 3.5–5.1)
Sodium: 136 mmol/L (ref 135–145)

## 2021-05-30 LAB — HEPATITIS PANEL, ACUTE
HCV Ab: NONREACTIVE
Hep A IgM: NONREACTIVE
Hep B C IgM: NONREACTIVE
Hepatitis B Surface Ag: NONREACTIVE

## 2021-05-30 LAB — PSA: Prostatic Specific Antigen: 0.74 ng/mL (ref 0.00–4.00)

## 2021-05-30 NOTE — Progress Notes (Signed)
BP 99/73   Pulse 74   Temp 97.8 F (36.6 C)   Ht 5\' 11"  (1.803 m)   Wt 201 lb (91.2 kg)   SpO2 98%   BMI 28.03 kg/m    Subjective:    Patient ID: Ian Roy, male    DOB: May 22, 1971, 50 y.o.   MRN: [id number]  HPI: Ian Roy is a 50 y.o. male presenting on 05/30/2021 for New Patient (Initial Visit)   HPI   .cc Chief Complaint  Patient presents with   New Patient (Initial Visit)    Pt is 54yoM who presents to re-establish care.  He was Last seen here 07/16/18  He had Polpyectomy 2019- due for repeat colonoscopy 2024  He Doesn't work He says he quit drinking- last drink July 2022.  He has history etoh  He is still going thru "legal stuff" for DWIs  He is Not covid vaccinated  He says his Left thumb hurts.  He denies injury.  He says he Just woke up that way.   He says it started 2 months ago.  He says he has a Sore place on left foot that he thinks started with a splinter or piece of glass.  He says he has Spells where it doesn't hurt and then it does         Relevant past medical, surgical, family and social history reviewed and updated as indicated. Interim medical history since our last visit reviewed. Allergies and medications reviewed and updated.  No current outpatient medications on file.   Review of Systems  Per HPI unless specifically indicated above     Objective:    BP 99/73   Pulse 74   Temp 97.8 F (36.6 C)   Ht 5\' 11"  (1.803 m)   Wt 201 lb (91.2 kg)   SpO2 98%   BMI 28.03 kg/m   Wt Readings from Last 3 Encounters:  05/30/21 201 lb (91.2 kg)  05/04/20 209 lb 7 oz (95 kg)  12/04/19 195 lb (88.5 kg)    Physical Exam Vitals reviewed.  Constitutional:      General: He is not in acute distress.    Appearance: He is well-developed. He is not ill-appearing.  HENT:     Head: Normocephalic and atraumatic.     Right Ear: Tympanic membrane, ear canal and external ear normal.     Left Ear: Tympanic membrane, ear canal  and external ear normal.  Eyes:     Extraocular Movements: Extraocular movements intact.     Conjunctiva/sclera: Conjunctivae normal.     Pupils: Pupils are equal, round, and reactive to light.  Neck:     Thyroid: No thyromegaly.  Cardiovascular:     Rate and Rhythm: Normal rate and regular rhythm.  Pulmonary:     Effort: Pulmonary effort is normal.     Breath sounds: Normal breath sounds. No wheezing or rales.  Abdominal:     General: Bowel sounds are normal.     Palpations: Abdomen is soft. There is no mass.     Tenderness: There is no abdominal tenderness.  Musculoskeletal:     Left hand: No swelling, deformity, tenderness or bony tenderness. Normal range of motion.     Cervical back: Neck supple.     Right lower leg: No edema.     Left lower leg: No edema.  Feet:     Comments: Very thickened skin plantar surface bilateral feet with white flaking skin.  No erythema.  No  foreign bodies.   Lymphadenopathy:     Cervical: No cervical adenopathy.  Skin:    General: Skin is warm and dry.     Findings: No rash.  Neurological:     Mental Status: He is alert and oriented to person, place, and time.  Psychiatric:        Behavior: Behavior normal. Behavior is cooperative.     Comments: logorrhea           Assessment & Plan:   Encounter Diagnoses  Name Primary?   Encounter to establish care Yes   Tinea pedis of both feet    Thumb tendonitis    Tobacco use disorder    Screening for prostate cancer    Hypokalemia       -check Psa and bmp (due to potassium low with last check) -for his thumb, he is to use Ice 10-20 minutes 3 or 4 times daily  and get thumb splint to wear.  He is shown what kind of splint to get -pt is counseled on Athletes foot care and it to use OTC antifungal cream -pt is educated and encouraged to get Covid vacciatnion -encouraged Smoking cessation -pt to follow up 1 year.  He is to contact office sooner prn

## 2021-05-30 NOTE — Patient Instructions (Signed)
Athlete's Foot Athlete's foot (tinea pedis) is a fungal infection of the skin on your feet. It often occurs on the skin that is between or underneath the toes. It can also occur on the soles of your feet. The infection can spread from person to person (is contagious). It can also spread when a person's bare feet come in contact with the fungus on shower floors or on items such as shoes. What are the causes? This condition is caused by a fungus that grows in warm, moist places. You can get athlete's foot by sharing shoes, shower stalls, towels, and wet floors with someone who is infected. Not washing your feet or changing your socks often enough can also lead to athlete's foot. What increases the risk? This condition is more likely to develop in: Men. People who have a weak body defense system (immune system). People who have diabetes. People who use public showers, such as at a gym. People who wear heavy-duty shoes, such as industrial or military shoes. Seasons with warm, humid weather. What are the signs or symptoms? Symptoms of this condition include: Itchy areas between your toes or on the soles of your feet. White, flaky, or scaly areas between your toes or on the soles of your feet. Very itchy small blisters between your toes or on the soles of your feet. Small cuts in your skin. These cuts can become infected. Thick or discolored toenails. How is this diagnosed? This condition may be diagnosed with a physical exam and a review of your medical history. Your health care provider may also take a skin or toenail sample to examine under a microscope. How is this treated? This condition is treated with antifungal medicines. These may be applied as powders, ointments, or creams. In severe cases, an oral antifungal medicine may be given. Follow these instructions at home: Medicines Apply or take over-the-counter and prescription medicines only as told by your health care provider. Apply your  antifungal medicine as told by your health care provider. Do not stop using the antifungal even if your condition improves. Foot care Do not scratch your feet. Keep your feet dry: Wear cotton or wool socks. Change your socks every day or if they become wet. Wear shoes that allow air to flow, such as sandals or canvas tennis shoes. Wash and dry your feet, including the area between your toes. Also, wash and dry your feet: Every day or as told by your health care provider. After exercising. General instructions Do not let others use towels, shoes, nail clippers, or other personal items that touch your feet. Protect your feet by wearing sandals in wet areas, such as locker rooms and shared showers. Keep all follow-up visits. This is important. If you have diabetes, keep your blood sugar under control. Contact a health care provider if: You have a fever. You have swelling, soreness, warmth, or redness in your foot. Your feet are not getting better with treatment. Your symptoms get worse. You have new symptoms. You have severe pain. Summary Athlete's foot (tinea pedis) is a fungal infection of the skin on your feet. It often occurs on skin that is between or underneath the toes. This condition is caused by a fungus that grows in warm, moist places. Symptoms include white, flaky, or scaly areas between your toes or on the soles of your feet. This condition is treated with antifungal medicines. Keep your feet clean. Always dry them thoroughly. This information is not intended to replace advice given to you by   your health care provider. Make sure you discuss any questions you have with your health care provider. Document Revised: 10/17/2020 Document Reviewed: 10/17/2020 Elsevier Patient Education  2022 [employer]vier Inc.    

## 2021-06-02 ENCOUNTER — Encounter (HOSPITAL_BASED_OUTPATIENT_CLINIC_OR_DEPARTMENT_OTHER): Payer: Self-pay | Admitting: *Deleted

## 2021-06-02 ENCOUNTER — Other Ambulatory Visit: Payer: Self-pay

## 2021-06-02 ENCOUNTER — Emergency Department (HOSPITAL_BASED_OUTPATIENT_CLINIC_OR_DEPARTMENT_OTHER)
Admission: EM | Admit: 2021-06-02 | Discharge: 2021-06-02 | Disposition: A | Discharge status: Home / Self Care | Payer: Self-pay | Attending: Emergency Medicine | Admitting: Emergency Medicine

## 2021-06-02 DIAGNOSIS — Z79899 Other long term (current) drug therapy: Secondary | ICD-10-CM | POA: Insufficient documentation

## 2021-06-02 DIAGNOSIS — D72829 Elevated white blood cell count, unspecified: Secondary | ICD-10-CM | POA: Insufficient documentation

## 2021-06-02 DIAGNOSIS — R112 Nausea with vomiting, unspecified: Secondary | ICD-10-CM | POA: Insufficient documentation

## 2021-06-02 DIAGNOSIS — Y906 Blood alcohol level of 120-199 mg/100 ml: Secondary | ICD-10-CM | POA: Insufficient documentation

## 2021-06-02 DIAGNOSIS — F17210 Nicotine dependence, cigarettes, uncomplicated: Secondary | ICD-10-CM | POA: Insufficient documentation

## 2021-06-02 DIAGNOSIS — F909 Attention-deficit hyperactivity disorder, unspecified type: Secondary | ICD-10-CM | POA: Insufficient documentation

## 2021-06-02 DIAGNOSIS — R1012 Left upper quadrant pain: Secondary | ICD-10-CM | POA: Insufficient documentation

## 2021-06-02 LAB — COMPREHENSIVE METABOLIC PANEL
ALT: 26 U/L (ref 0–44)
AST: 18 U/L (ref 15–41)
Albumin: 4.4 g/dL (ref 3.5–5.0)
Alkaline Phosphatase: 48 U/L (ref 38–126)
Anion gap: 9 (ref 5–15)
BUN: 16 mg/dL (ref 6–20)
CO2: 29 mmol/L (ref 22–32)
Calcium: 9.4 mg/dL (ref 8.9–10.3)
Chloride: 102 mmol/L (ref 98–111)
Creatinine, Ser: 1.12 mg/dL (ref 0.61–1.24)
GFR, Estimated: >60 mL/min (ref >60)
Glucose, Bld: 125 mg/dL — ABNORMAL HIGH (ref 70–99)
Potassium: 4 mmol/L (ref 3.5–5.1)
Sodium: 140 mmol/L (ref 135–145)
Total Bilirubin: 0.6 mg/dL (ref 0.3–1.2)
Total Protein: 7.3 g/dL (ref 6.5–8.1)

## 2021-06-02 LAB — CBC WITH DIFFERENTIAL/PLATELET
Abs Immature Granulocytes: 0.07 10*3/uL (ref 0.00–0.07)
Basophils Absolute: 0.1 10*3/uL (ref 0.0–0.1)
Basophils Relative: 1 %
Eosinophils Absolute: 0.1 10*3/uL (ref 0.0–0.5)
Eosinophils Relative: 1 %
HCT: 49.6 % (ref 39.0–52.0)
Hemoglobin: 16.8 g/dL (ref 13.0–17.0)
Immature Granulocytes: 0 %
Lymphocytes Relative: 14 %
Lymphs Abs: 2.2 10*3/uL (ref 0.7–4.0)
MCH: 30.1 pg (ref 26.0–34.0)
MCHC: 33.9 g/dL (ref 30.0–36.0)
MCV: 88.7 fL (ref 80.0–100.0)
Monocytes Absolute: 0.9 10*3/uL (ref 0.1–1.0)
Monocytes Relative: 6 %
Neutro Abs: 12.4 10*3/uL — ABNORMAL HIGH (ref 1.7–7.7)
Neutrophils Relative %: 78 %
Platelets: 328 10*3/uL (ref 150–400)
RBC: 5.59 MIL/uL (ref 4.22–5.81)
RDW: 13.9 % (ref 11.5–15.5)
WBC: 15.7 10*3/uL — ABNORMAL HIGH (ref 4.0–10.5)
nRBC: 0 % (ref 0.0–0.2)

## 2021-06-02 LAB — ETHANOL: Alcohol, Ethyl (B): <10 mg/dL (ref <10)

## 2021-06-02 LAB — LIPASE, BLOOD: Lipase: 23 U/L (ref 11–51)

## 2021-06-02 MED ORDER — OMEPRAZOLE 20 MG PO CPDR: 20.0000 mg | DELAYED_RELEASE_CAPSULE | Freq: Every day | ORAL | 0 refills | Status: DC

## 2021-06-02 MED ORDER — DROPERIDOL 2.5 MG/ML IJ SOLN
2.5000 mg | Freq: Once | INTRAMUSCULAR | Status: AC
  Administered 2021-06-02: 2.5 mg via INTRAVENOUS
  Filled 2021-06-02: qty 2

## 2021-06-02 MED ORDER — SODIUM CHLORIDE 0.9 % IV BOLUS
1000.0000 mL | Freq: Once | INTRAVENOUS | Status: AC
  Administered 2021-06-02: 1000 mL via INTRAVENOUS

## 2021-06-02 NOTE — ED Notes (Signed)
Pt. Is throwing his blanket in the floor and continues to blow his nose in the bed sheet as well as expel curse words from his mouth while nurse is at bedside.  When RN leaves bedside the Pt. Gets quiet.

## 2021-06-02 NOTE — ED Provider Notes (Signed)
[facility] Provider Note   CSN: [id number] Arrival date & time: 06/02/21  1228     History Chief Complaint  Patient presents with   Abdominal Pain   Emesis    Ian Roy is a 50 y.o. male.  49 y.o male with a PMH of Alcohol abuse, GERD, presents to the ED with a chief complaint of abdominal pain along with emesis that began yesterday.  Patient reports he has a prior history of stomach ulcers, in the recent couple days he has been "abusing", tomato-based, chocolate chip cookies daily.  I see feel that this likely upset his stomach.  He orts taking Pepto-Bismol, Tylenol PM without much improvement in symptoms.  States that he usually will take Tums in the past to help with his symptoms however today "did not find them ".  He does report 1 episode yesterday that had 2 to 3 drops of blood present.  He is endorsing most of his pain along the left upper quadrant.  States his last alcohol intake was several months ago. He denies any diarrhea, fever, shortness of breath.         The history is provided by the patient and medical records.  Abdominal Pain Pain location:  Epigastric and L flank Associated symptoms: nausea and vomiting   Associated symptoms: no chest pain, no chills, no constipation, no diarrhea, no fever, no shortness of breath and no sore throat   Emesis Associated symptoms: abdominal pain   Associated symptoms: no chills, no diarrhea, no fever, no headaches and no sore throat       Past Medical History:  Diagnosis Date   Alcohol use    daily   Bronchitis    Chronic abdominal pain    GERD (gastroesophageal reflux disease)    Nausea and vomiting    recurrent    Patient Active Problem List   Diagnosis Date Noted   Intractable nausea and vomiting 05/04/2020   Left upper quadrant abdominal pain 05/04/2020   Family history of colonic polyps 02/18/2018   Elevated LFTs 03/15/2016   Emesis 11/07/2015   Esophageal reflux  11/07/2015   Diarrhea 11/07/2015   Alcohol abuse 11/02/2015   Cigarette nicotine dependence without complication 78/24/2353    Past Surgical History:  Procedure Laterality Date   colonoscopy     COLONOSCOPY WITH PROPOFOL N/A 04/01/2018   Procedure: COLONOSCOPY WITH PROPOFOL;  Surgeon: Daneil Dolin, MD;  Location: [facility];  Service: Endoscopy;  Laterality: N/A;  9:15am   None     POLYPECTOMY  04/01/2018   Procedure: POLYPECTOMY;  Surgeon: Daneil Dolin, MD;  Location: [facility];  Service: Endoscopy;;       Family History  Problem Relation Age of Onset   Cancer Mother        breast   Diabetes Mother    Colon polyps Father    Cancer Maternal Aunt    Colon cancer Neg Hx     Social History   Tobacco Use   Smoking status: Every Day    Packs/day: 1.00    Years: 14.00    Pack years: 14.00    Types: Cigarettes   Smokeless tobacco: Never  Vaping Use   Vaping Use: Never used  Substance Use Topics   Alcohol use: Not Currently    Comment: history heavy etoh. last drink july 2022   Drug use: Not Currently    Home Medications Prior to Admission medications   Medication Sig Start  Date End Date Taking? Authorizing Provider  omeprazole (PRILOSEC) 20 MG capsule Take 1 capsule (20 mg total) by mouth daily for 10 days. 06/02/21 06/12/21 Yes Janeece Fitting, PA-C    Allergies    Patient has no known allergies.  Review of Systems   Review of Systems  Constitutional:  Negative for chills and fever.  HENT:  Negative for sore throat.   Respiratory:  Negative for shortness of breath.   Cardiovascular:  Negative for chest pain.  Gastrointestinal:  Positive for abdominal pain, nausea and vomiting. Negative for constipation and diarrhea.  Genitourinary:  Negative for flank pain.  Musculoskeletal:  Negative for back pain.  Neurological:  Negative for headaches.  All other systems reviewed and are negative.  Physical Exam Updated Vital Signs BP 132/86   Pulse (!) 59    Temp 98.3 F (36.8 C) (Oral)   Resp 15   Ht 5\' 11"  (1.803 m)   Wt 90.7 kg   SpO2 97%   BMI 27.89 kg/m   Physical Exam Vitals and nursing note reviewed.  Constitutional:      Appearance: He is well-developed.  HENT:     Head: Normocephalic and atraumatic.  Cardiovascular:     Rate and Rhythm: Normal rate.  Pulmonary:     Effort: Pulmonary effort is normal.     Breath sounds: No wheezing.  Abdominal:     General: Abdomen is flat. Bowel sounds are decreased.     Palpations: Abdomen is soft.     Tenderness: There is abdominal tenderness in the left upper quadrant.  Skin:    General: Skin is warm and dry.  Neurological:     Mental Status: He is alert and oriented to person, place, and time.  Psychiatric:        Attention and Perception: He is inattentive.        Behavior: Behavior is hyperactive.    ED Results / Procedures / Treatments   Labs (all labs ordered are listed, but only abnormal results are displayed) Labs Reviewed  CBC WITH DIFFERENTIAL/PLATELET - Abnormal; Notable for the following components:      Result Value   WBC 15.7 (*)    Neutro Abs 12.4 (*)    All other components within normal limits  COMPREHENSIVE METABOLIC PANEL - Abnormal; Notable for the following components:   Glucose, Bld 125 (*)    All other components within normal limits  LIPASE, BLOOD  ETHANOL  URINALYSIS, ROUTINE W REFLEX MICROSCOPIC    EKG None  Radiology No results found.  Procedures Procedures   Medications Ordered in ED Medications  sodium chloride 0.9 % bolus 1,000 mL (0 mLs Intravenous Stopped 06/02/21 1441)  droperidol (INAPSINE) 2.5 MG/ML injection 2.5 mg (2.5 mg Intravenous Given 06/02/21 1413)    ED Course  I have reviewed the triage vital signs and the nursing notes.  Pertinent labs & imaging results that were available during my care of the patient were reviewed by me and considered in my medical decision making (see chart for details).  Clinical Course as  of 06/02/21 1619  Thu Jun 02, 2021  1344 WBC(!): 15.7 [JS]  1609 Lipase: 23 [JS]    Clinical Course User Index [JS] Janeece Fitting, PA-C   MDM Rules/Calculators/A&P   Patient with a prior chronic history of epigastric pain presents to the ED today with nausea, vomiting along with abdominal pain that began yesterday.  Similar symptoms in the past consistent with his prior gastric ulcers.  He reports he  has not had any alcohol intake, however has been eating "a lot of tomato-based foods ".  In addition he does endorse drinking some beer, however reports he did not drink any prior to episode beginning.  My primary evaluation, patient is ambulatory in the room, removing his gown, vomiting in the sink.  His abdomen is soft, more tender to palpation along the LUQ. Bowel sounds are diminished but reports no diarrhea.  He is overall with stable vital signs and no tachycardia, no signs of sepsis.  Interpretation of his blood work reveal a CBC with a slight leukocytosis of 15.7, suspect likely reactive to multiple episodes of emesis.  Hemoglobin is within normal limits.  CMP without any electrolyte derangement, creatinine levels within normal limits.  LFTs are unremarkable there is no pain along the right upper quadrant.  Most of his pain is located around the left upper quadrant.  Alcohol level is negative on today's visit.  EKG was obtained prior to providing him with symptomatic relief, QTC within normal range in order for him to receive droperidol.  Vernard Gambles has been resting for approximately 1 hour after receiving medication, reports his symptoms have resolved, however he continues to feel some residual pain.  He has not had any further episodes of vomiting.  He is tolerating ice chips.  I do not feel there is a threatening condition at this time.  He does have a prior history of chronic abdominal pain and likely exacerbated after 2 days eating based foods that he has been eating daily for the last couple  days.  He is agreeable to following up with PCP, return precautions have been discussed at length.  Patient stable for discharge.   Portions of this note were generated with [employer]. Dictation errors may occur despite best attempts at proofreading.  Final Clinical Impression(s) / ED Diagnoses Final diagnoses:  Left upper quadrant abdominal pain    Rx / DC Orders ED Discharge Orders          Ordered    omeprazole (PRILOSEC) 20 MG capsule  Daily        06/02/21 1617             Janeece Fitting, PA-C 06/02/21 1619    Jeanell Sparrow, DO 06/02/21 1744

## 2021-06-02 NOTE — Discharge Instructions (Addendum)
Your laboratory results are within normal limits today.  Please discontinue any acidic, highly condiment food.  I have prescribed medication to help with your pain.  Please take 1 tablet daily for the next 10 days.  You will also need to follow-up with your primary care physician.

## 2021-06-02 NOTE — ED Triage Notes (Signed)
Abdominal pain and vomiting since yesterday. He is very talkative.

## 2021-06-13 ENCOUNTER — Encounter: Payer: Self-pay | Admitting: Physician Assistant

## 2021-06-13 ENCOUNTER — Ambulatory Visit: Payer: Self-pay | Admitting: Physician Assistant

## 2021-06-13 VITALS — BP 109/70 | HR 85 | Temp 97.3°F | Wt 205.0 lb

## 2021-06-13 DIAGNOSIS — K219 Gastro-esophageal reflux disease without esophagitis: Secondary | ICD-10-CM

## 2021-06-13 DIAGNOSIS — B353 Tinea pedis: Secondary | ICD-10-CM

## 2021-06-13 DIAGNOSIS — F17200 Nicotine dependence, unspecified, uncomplicated: Secondary | ICD-10-CM

## 2021-06-13 DIAGNOSIS — M65331 Trigger finger, right middle finger: Secondary | ICD-10-CM

## 2021-06-13 NOTE — Progress Notes (Signed)
BP 109/70   Pulse 85   Temp (!) 97.3 F (36.3 C)   Wt 205 lb (93 kg)   SpO2 97%   BMI 28.59 kg/m    Subjective:    Patient ID: GUERIN LASHOMB, male    DOB: [birth year], 50 y.o.   MRN: [id number]  HPI: Ian Roy is a 50 y.o. male presenting on 06/13/2021 for Abdominal Pain   HPI   Pt with history of stomach issues in the past.  It hadn't hurt in a long time, since he quit drinking, until he went to ER on 06/02/21 after eating foods that he knows would set off his pain.  He was given rx for omeprazole and he went back to watching what he eats.  His Stomach feeling better now.   Also he says he cut his feet with a razor trying to help his athlete's foot.  Also he complains of right 3rd finger being unable to bend after sustaining a burn there at some point in the past.  It doesn't hurt, he just can't bend it.     Relevant past medical, surgical, family and social history reviewed and updated as indicated. Interim medical history since our last visit reviewed. Allergies and medications reviewed and updated.   CURRENT MEDS: None     Review of Systems  Per HPI unless specifically indicated above     Objective:    BP 109/70   Pulse 85   Temp (!) 97.3 F (36.3 C)   Wt 205 lb (93 kg)   SpO2 97%   BMI 28.59 kg/m   Wt Readings from Last 3 Encounters:  06/13/21 205 lb (93 kg)  06/02/21 200 lb (90.7 kg)  05/30/21 201 lb (91.2 kg)    Physical Exam Vitals reviewed.  Constitutional:      General: He is not in acute distress.    Appearance: He is well-developed. He is not ill-appearing.  HENT:     Head: Normocephalic and atraumatic.  Cardiovascular:     Rate and Rhythm: Normal rate and regular rhythm.  Pulmonary:     Effort: Pulmonary effort is normal.     Breath sounds: Normal breath sounds. No wheezing.  Abdominal:     General: Bowel sounds are normal.     Palpations: Abdomen is soft.     Tenderness: There is no abdominal tenderness.   Musculoskeletal:     Right hand: No swelling or tenderness. Decreased range of motion. Normal capillary refill. Normal pulse.     Cervical back: Neck supple.     Comments: Right 3rd finger unable to bend dip joint  Feet:     Comments: Dry skin with white areas.  Many superficial lacerations with no active bleeding, no erythema or signs secondary infection. Lymphadenopathy:     Cervical: No cervical adenopathy.  Skin:    General: Skin is warm and dry.  Neurological:     Mental Status: He is alert and oriented to person, place, and time.  Psychiatric:        Behavior: Behavior normal.     Comments: logorrhea            Assessment & Plan:    Encounter Diagnoses  Name Primary?   Gastroesophageal reflux disease, unspecified whether esophagitis present Yes   Tobacco use disorder    Tinea pedis of both feet    Trigger middle finger of right hand      -discussed with pt that he is doing well  so long as he watches what he eats.  He can use OTC ppi or h-2 blocker prn.  Discussed with pt that He does not need referral to GI at this time.  He was given reading information on foods for GERD -pt urged to avoid using a razor on his feet.  He is to continue to use antifungal cream.  He can use pumice stone if desired -for Right 3rd finger- will refer to orthopedics to determine if surgery or release procedure might help ROM of the finger -pt was given application for [employer] financial assistance -pt to follow up as scheduled.  He is to contact office sooner prn

## 2021-06-13 NOTE — Patient Instructions (Signed)
Food Choices for Gastroesophageal Reflux Disease, Adult °When you have gastroesophageal reflux disease (GERD), the foods you eat and your eating habits are very important. Choosing the right foods can help ease the discomfort of GERD. Consider working with a dietitian to help you make healthy food choices. °What are tips for following this plan? °Reading food labels °Look for foods that are low in saturated fat. Foods that have less than 5% of daily value (DV) of fat and 0 g of trans fats may help with your symptoms. °Cooking °Cook foods using methods other than frying. This may include baking, steaming, grilling, or broiling. These are all methods that do not need a lot of fat for cooking. °To add flavor, try to use herbs that are low in spice and acidity. °Meal planning ° °Choose healthy foods that are low in fat, such as fruits, vegetables, whole grains, low-fat dairy products, lean meats, fish, and poultry. °Eat frequent, small meals instead of three large meals each day. Eat your meals slowly, in a relaxed setting. Avoid bending over or lying down until 2-3 hours after eating. °Limit high-fat foods such as fatty meats or fried foods. °Limit your intake of fatty foods, such as oils, butter, and shortening. °Avoid the following as told by your health care provider: °Foods that cause symptoms. These may be different for different people. Keep a food diary to keep track of foods that cause symptoms. °Alcohol. °Drinking large amounts of liquid with meals. °Eating meals during the 2-3 hours before bed. °Lifestyle °Maintain a healthy weight. Ask your health care provider what weight is healthy for you. If you need to lose weight, work with your health care provider to do so safely. °Exercise for at least 30 minutes on 5 or more days each week, or as told by your health care provider. °Avoid wearing clothes that fit tightly around your waist and chest. °Do not use any products that contain nicotine or tobacco. These  products include cigarettes, chewing tobacco, and vaping devices, such as e-cigarettes. If you need help quitting, ask your health care provider. °Sleep with the head of your bed raised. Use a wedge under the mattress or blocks under the bed frame to raise the head of the bed. °Chew sugar-free gum after mealtimes. °What foods should I eat? °Eat a healthy, well-balanced diet of fruits, vegetables, whole grains, low-fat dairy products, lean meats, fish, and poultry. Each person is different. Foods that may trigger symptoms in one person may not trigger any symptoms in another person. Work with your health care provider to identify foods that are safe for you. °The items listed above may not be a complete list of recommended foods and beverages. Contact a dietitian for more information. °What foods should I avoid? °Limiting some of these foods may help manage the symptoms of GERD. Everyone is different. Consult a dietitian or your health care provider to help you identify the exact foods to avoid, if any. °Fruits °Any fruits prepared with added fat. Any fruits that cause symptoms. For some people this may include citrus fruits, such as oranges, grapefruit, pineapple, and lemons. °Vegetables °Deep-fried vegetables. French fries. Any vegetables prepared with added fat. Any vegetables that cause symptoms. For some people, this may include tomatoes and tomato products, chili peppers, onions and garlic, and horseradish. °Grains °Pastries or quick breads with added fat. °Meats and other proteins °High-fat meats, such as fatty beef or pork, hot dogs, ribs, ham, sausage, salami, and bacon. Fried meat or protein, including fried   fish and fried chicken. Nuts and nut butters, in large amounts. °Dairy °Whole milk and chocolate milk. Sour cream. Cream. Ice cream. Cream cheese. Milkshakes. °Fats and oils °Butter. Margarine. Shortening. Ghee. °Beverages °Coffee and tea, with or without caffeine. Carbonated beverages. Sodas. Energy  drinks. Fruit juice made with acidic fruits, such as orange or grapefruit. Tomato juice. Alcoholic drinks. °Sweets and desserts °Chocolate and cocoa. Donuts. °Seasonings and condiments °Pepper. Peppermint and spearmint. Added salt. Any condiments, herbs, or seasonings that cause symptoms. For some people, this may include curry, hot sauce, or vinegar-based salad dressings. °The items listed above may not be a complete list of foods and beverages to avoid. Contact a dietitian for more information. °Questions to ask your health care provider °Diet and lifestyle changes are usually the first steps that are taken to manage symptoms of GERD. If diet and lifestyle changes do not improve your symptoms, talk with your health care provider about taking medicines. °Where to find more information °[employer]: aboutgerd.org °Summary °When you have gastroesophageal reflux disease (GERD), food and lifestyle choices may be very helpful in easing the discomfort of GERD. °Eat frequent, small meals instead of three large meals each day. Eat your meals slowly, in a relaxed setting. Avoid bending over or lying down until 2-3 hours after eating. °Limit high-fat foods such as fatty meats or fried foods. °This information is not intended to replace advice given to you by your health care provider. Make sure you discuss any questions you have with your health care provider. °Document Revised: 01/05/2020 Document Reviewed: 01/05/2020 °Elsevier Patient Education © 2022 [employer] ° °

## 2021-06-21 ENCOUNTER — Ambulatory Visit: Payer: Self-pay | Admitting: Orthopedic Surgery

## 2021-07-01 ENCOUNTER — Other Ambulatory Visit: Payer: Self-pay

## 2021-07-01 ENCOUNTER — Encounter: Payer: Self-pay | Admitting: Orthopedic Surgery

## 2021-07-01 ENCOUNTER — Ambulatory Visit (INDEPENDENT_AMBULATORY_CARE_PROVIDER_SITE_OTHER): Payer: Self-pay | Admitting: Orthopedic Surgery

## 2021-07-01 DIAGNOSIS — M25641 Stiffness of right hand, not elsewhere classified: Secondary | ICD-10-CM | POA: Insufficient documentation
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
## 2022-05-08 ENCOUNTER — Ambulatory Visit: Payer: Self-pay | Admitting: Physician Assistant

## 2023-01-25 ENCOUNTER — Emergency Department (HOSPITAL_COMMUNITY): Payer: 59

## 2023-01-25 ENCOUNTER — Inpatient Hospital Stay (HOSPITAL_COMMUNITY)
Admission: EM | Admit: 2023-01-25 | Discharge: 2023-01-29 | DRG: 897 | Disposition: A | Discharge status: Home / Self Care | Payer: 59 | Attending: Internal Medicine | Admitting: Internal Medicine

## 2023-01-25 DIAGNOSIS — N179 Acute kidney failure, unspecified: Secondary | ICD-10-CM | POA: Diagnosis present

## 2023-01-25 DIAGNOSIS — E876 Hypokalemia: Secondary | ICD-10-CM | POA: Diagnosis present

## 2023-01-25 DIAGNOSIS — Z833 Family history of diabetes mellitus: Secondary | ICD-10-CM | POA: Diagnosis not present

## 2023-01-25 DIAGNOSIS — R030 Elevated blood-pressure reading, without diagnosis of hypertension: Secondary | ICD-10-CM | POA: Diagnosis present

## 2023-01-25 DIAGNOSIS — F23 Brief psychotic disorder: Secondary | ICD-10-CM | POA: Diagnosis present

## 2023-01-25 DIAGNOSIS — Z803 Family history of malignant neoplasm of breast: Secondary | ICD-10-CM | POA: Diagnosis not present

## 2023-01-25 DIAGNOSIS — Z781 Physical restraint status: Secondary | ICD-10-CM

## 2023-01-25 DIAGNOSIS — E872 Acidosis, unspecified: Secondary | ICD-10-CM | POA: Diagnosis present

## 2023-01-25 DIAGNOSIS — R4182 Altered mental status, unspecified: Secondary | ICD-10-CM

## 2023-01-25 DIAGNOSIS — K219 Gastro-esophageal reflux disease without esophagitis: Secondary | ICD-10-CM | POA: Diagnosis present

## 2023-01-25 DIAGNOSIS — Z83719 Family history of colon polyps, unspecified: Secondary | ICD-10-CM

## 2023-01-25 DIAGNOSIS — F17210 Nicotine dependence, cigarettes, uncomplicated: Secondary | ICD-10-CM | POA: Diagnosis present

## 2023-01-25 DIAGNOSIS — R0902 Hypoxemia: Secondary | ICD-10-CM | POA: Diagnosis present

## 2023-01-25 DIAGNOSIS — R319 Hematuria, unspecified: Secondary | ICD-10-CM | POA: Diagnosis present

## 2023-01-25 DIAGNOSIS — F15951 Other stimulant use, unspecified with stimulant-induced psychotic disorder with hallucinations: Secondary | ICD-10-CM | POA: Diagnosis not present

## 2023-01-25 DIAGNOSIS — E860 Dehydration: Secondary | ICD-10-CM | POA: Diagnosis present

## 2023-01-25 DIAGNOSIS — Z046 Encounter for general psychiatric examination, requested by authority: Secondary | ICD-10-CM

## 2023-01-25 DIAGNOSIS — M6282 Rhabdomyolysis: Secondary | ICD-10-CM | POA: Diagnosis present

## 2023-01-25 DIAGNOSIS — F29 Unspecified psychosis not due to a substance or known physiological condition: Principal | ICD-10-CM

## 2023-01-25 LAB — COMPREHENSIVE METABOLIC PANEL
ALT: 28 U/L (ref 0–44)
AST: 27 U/L (ref 15–41)
Albumin: 4.4 g/dL (ref 3.5–5.0)
Alkaline Phosphatase: 52 U/L (ref 38–126)
Anion gap: 16 — ABNORMAL HIGH (ref 5–15)
BUN: 18 mg/dL (ref 6–20)
CO2: 19 mmol/L — ABNORMAL LOW (ref 22–32)
Calcium: 10.3 mg/dL (ref 8.9–10.3)
Chloride: 107 mmol/L (ref 98–111)
Creatinine, Ser: 2.38 mg/dL — ABNORMAL HIGH (ref 0.61–1.24)
GFR, Estimated: 32 mL/min — ABNORMAL LOW (ref >60)
Glucose, Bld: 102 mg/dL — ABNORMAL HIGH (ref 70–99)
Potassium: 2.9 mmol/L — ABNORMAL LOW (ref 3.5–5.1)
Sodium: 142 mmol/L (ref 135–145)
Total Bilirubin: 0.8 mg/dL (ref 0.3–1.2)
Total Protein: 7.5 g/dL (ref 6.5–8.1)

## 2023-01-25 LAB — URINALYSIS, ROUTINE W REFLEX MICROSCOPIC
Bacteria, UA: NONE SEEN
Glucose, UA: NEGATIVE mg/dL
Ketones, ur: 5 mg/dL — AB
Leukocytes,Ua: NEGATIVE
Nitrite: NEGATIVE
Protein, ur: 100 mg/dL — AB
Specific Gravity, Urine: 1.027 (ref 1.005–1.030)
pH: 5 (ref 5.0–8.0)

## 2023-01-25 LAB — BASIC METABOLIC PANEL
Anion gap: 8 (ref 5–15)
BUN: 22 mg/dL — ABNORMAL HIGH (ref 6–20)
CO2: 21 mmol/L — ABNORMAL LOW (ref 22–32)
Calcium: 8.6 mg/dL — ABNORMAL LOW (ref 8.9–10.3)
Chloride: 112 mmol/L — ABNORMAL HIGH (ref 98–111)
Creatinine, Ser: 1.58 mg/dL — ABNORMAL HIGH (ref 0.61–1.24)
GFR, Estimated: 53 mL/min — ABNORMAL LOW (ref >60)
Glucose, Bld: 83 mg/dL (ref 70–99)
Potassium: 3.7 mmol/L (ref 3.5–5.1)
Sodium: 141 mmol/L (ref 135–145)

## 2023-01-25 LAB — CBC WITH DIFFERENTIAL/PLATELET
Abs Immature Granulocytes: 0.06 10*3/uL (ref 0.00–0.07)
Basophils Absolute: 0.1 10*3/uL (ref 0.0–0.1)
Basophils Relative: 0 %
Eosinophils Absolute: 0.1 10*3/uL (ref 0.0–0.5)
Eosinophils Relative: 1 %
HCT: 46.7 % (ref 39.0–52.0)
Hemoglobin: 15.9 g/dL (ref 13.0–17.0)
Immature Granulocytes: 0 %
Lymphocytes Relative: 7 %
Lymphs Abs: 1.1 10*3/uL (ref 0.7–4.0)
MCH: 30.1 pg (ref 26.0–34.0)
MCHC: 34 g/dL (ref 30.0–36.0)
MCV: 88.3 fL (ref 80.0–100.0)
Monocytes Absolute: 0.8 10*3/uL (ref 0.1–1.0)
Monocytes Relative: 5 %
Neutro Abs: 13.6 10*3/uL — ABNORMAL HIGH (ref 1.7–7.7)
Neutrophils Relative %: 87 %
Platelets: 193 10*3/uL (ref 150–400)
RBC: 5.29 MIL/uL (ref 4.22–5.81)
RDW: 12.8 % (ref 11.5–15.5)
WBC: 15.8 10*3/uL — ABNORMAL HIGH (ref 4.0–10.5)
nRBC: 0 % (ref 0.0–0.2)

## 2023-01-25 LAB — ETHANOL: Alcohol, Ethyl (B): <10 mg/dL (ref <10)

## 2023-01-25 LAB — RAPID URINE DRUG SCREEN, HOSP PERFORMED
Amphetamines: POSITIVE — AB
Barbiturates: NOT DETECTED
Benzodiazepines: NOT DETECTED
Cocaine: NOT DETECTED
Opiates: NOT DETECTED
Tetrahydrocannabinol: NOT DETECTED

## 2023-01-25 LAB — SALICYLATE LEVEL: Salicylate Lvl: <7 mg/dL — ABNORMAL LOW (ref 7.0–30.0)

## 2023-01-25 LAB — LACTIC ACID, PLASMA
Lactic Acid, Venous: 3.6 mmol/L (ref 0.5–1.9)
Lactic Acid, Venous: 0.9 mmol/L (ref 0.5–1.9)

## 2023-01-25 LAB — GLUCOSE, CAPILLARY: Glucose-Capillary: 74 mg/dL (ref 70–99)

## 2023-01-25 LAB — AMMONIA: Ammonia: 62 umol/L — ABNORMAL HIGH (ref 9–35)

## 2023-01-25 LAB — TSH: TSH: 0.839 u[IU]/mL (ref 0.350–4.500)

## 2023-01-25 LAB — ACETAMINOPHEN LEVEL: Acetaminophen (Tylenol), Serum: <10 ug/mL — ABNORMAL LOW (ref 10–30)

## 2023-01-25 LAB — CK: Total CK: 314 U/L (ref 49–397)

## 2023-01-25 LAB — HIV ANTIBODY (ROUTINE TESTING W REFLEX): HIV Screen 4th Generation wRfx: NONREACTIVE

## 2023-01-25 MED ORDER — LORAZEPAM 2 MG/ML IJ SOLN: 1.0000 mg | INTRAMUSCULAR | Status: DC | PRN

## 2023-01-25 MED ORDER — LORAZEPAM 2 MG/ML IJ SOLN
1.0000 mg | INTRAMUSCULAR | Status: DC | PRN
  Filled 2023-01-25: qty 1

## 2023-01-25 MED ORDER — SODIUM CHLORIDE 0.9 % IV BOLUS
1000.0000 mL | Freq: Once | INTRAVENOUS | Status: AC
  Administered 2023-01-25: 1000 mL via INTRAVENOUS

## 2023-01-25 MED ORDER — POTASSIUM CHLORIDE 10 MEQ/100ML IV SOLN
10.0000 meq | INTRAVENOUS | Status: AC
  Administered 2023-01-25 (×3): 10 meq via INTRAVENOUS
  Filled 2023-01-25 (×3): qty 100

## 2023-01-25 MED ORDER — LORAZEPAM 2 MG/ML IJ SOLN
1.0000 mg | INTRAMUSCULAR | Status: DC | PRN
  Administered 2023-01-25 – 2023-01-26 (×3): 1 mg via INTRAVENOUS
  Filled 2023-01-25 (×2): qty 1

## 2023-01-25 MED ORDER — ACETAMINOPHEN 325 MG PO TABS: 650.0000 mg | ORAL_TABLET | Freq: Four times a day (QID) | ORAL | Status: DC | PRN

## 2023-01-25 MED ORDER — ENOXAPARIN SODIUM 30 MG/0.3ML IJ SOSY
30.0000 mg | PREFILLED_SYRINGE | INTRAMUSCULAR | Status: DC
  Administered 2023-01-25 – 2023-01-29 (×5): 30 mg via SUBCUTANEOUS
  Filled 2023-01-25 (×5): qty 0.3

## 2023-01-25 MED ORDER — ACETAMINOPHEN 650 MG RE SUPP: 650.0000 mg | Freq: Four times a day (QID) | RECTAL | Status: DC | PRN

## 2023-01-25 MED ORDER — LACTATED RINGERS IV SOLN: INTRAVENOUS | Status: DC

## 2023-01-25 MED ORDER — ZIPRASIDONE MESYLATE 20 MG IM SOLR: 20.0000 mg | Freq: Once | INTRAMUSCULAR | Status: DC

## 2023-01-25 MED ORDER — SENNOSIDES-DOCUSATE SODIUM 8.6-50 MG PO TABS: 1.0000 | ORAL_TABLET | Freq: Every evening | ORAL | Status: DC | PRN

## 2023-01-25 MED ORDER — SODIUM CHLORIDE 0.9 % IV BOLUS
1000.0000 mL | Freq: Once | INTRAVENOUS | Status: AC
Start: 2023-01-25 — End: 2023-01-25
  Administered 2023-01-25: 1000 mL via INTRAVENOUS

## 2023-01-25 MED ORDER — LORAZEPAM 2 MG/ML IJ SOLN
2.0000 mg | Freq: Once | INTRAMUSCULAR | Status: AC
  Administered 2023-01-25: 2 mg via INTRAVENOUS
  Filled 2023-01-25 (×2): qty 1

## 2023-01-25 MED ORDER — POTASSIUM CHLORIDE 10 MEQ/100ML IV SOLN
10.0000 meq | INTRAVENOUS | Status: AC
  Administered 2023-01-25 (×2): 10 meq via INTRAVENOUS
  Filled 2023-01-25 (×2): qty 100

## 2023-01-25 MED ORDER — LORAZEPAM 2 MG/ML IJ SOLN
2.0000 mg | Freq: Once | INTRAMUSCULAR | Status: AC
  Administered 2023-01-25: 2 mg via INTRAVENOUS
  Filled 2023-01-25: qty 1
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
## 2023-01-25 NOTE — ED Notes (Signed)
[facility] docs in  [facility] zone; patient in [place]. Case number:[id number] Exp:02-01-2023

## 2023-01-25 NOTE — Progress Notes (Signed)
Patient no longer has on violent restraints, non-violent wrist and ankle restraints in place.

## 2023-01-25 NOTE — ED Notes (Signed)
MD Tegeler notified about patient aggression and new orders in

## 2023-01-25 NOTE — ED Provider Notes (Signed)
[place] EMERGENCY DEPARTMENT AT [facility] Provider Note   CSN: [id number] Arrival date & time: 01/25/23  9563     History  Chief Complaint  Patient presents with   Psychosis    Ian Roy is a 52 y.o. male.  The history is provided by the EMS personnel, the police and medical records. No language interpreter was used.  Mental Health Problem Presenting symptoms: aggressive behavior, agitation and bizarre behavior   Patient accompanied by:  Law enforcement Degree of incapacity (severity):  Severe Onset quality:  Unable to specify Timing:  Constant Treatment compliance:  Unable to specify Relieved by:  Nothing Worsened by:  Nothing      Home Medications Prior to Admission medications   Medication Sig Start Date End Date Taking? Authorizing Provider  omeprazole (PRILOSEC) 20 MG capsule Take 1 capsule (20 mg total) by mouth daily for 10 days. 06/02/21 06/12/21  Claude Manges, PA-C      Allergies    Patient has no known allergies.    Review of Systems   Review of Systems  Unable to perform ROS: Psychiatric disorder (acute psychosis)  Psychiatric/Behavioral:  Positive for agitation.     Physical Exam Updated Vital Signs BP (!) 127/97   Pulse (!) 110   Temp 98.9 F (37.2 C) (Temporal)   Resp 16   SpO2 92%  Physical Exam Vitals and nursing note reviewed.  Constitutional:      General: He is in acute distress.     Appearance: He is well-developed. He is not ill-appearing, toxic-appearing or diaphoretic.  HENT:     Head: Normocephalic and atraumatic.     Nose: No congestion or rhinorrhea.     Mouth/Throat:     Mouth: Mucous membranes are moist.     Pharynx: No oropharyngeal exudate or posterior oropharyngeal erythema.  Eyes:     Conjunctiva/sclera: Conjunctivae normal.     Pupils: Pupils are equal, round, and reactive to light.  Cardiovascular:     Rate and Rhythm: Normal rate and regular rhythm.     Heart sounds: No murmur  heard. Pulmonary:     Effort: Pulmonary effort is normal. No respiratory distress.     Breath sounds: Normal breath sounds. No wheezing, rhonchi or rales.  Chest:     Chest wall: No tenderness.  Abdominal:     General: Abdomen is flat.     Palpations: Abdomen is soft.     Tenderness: There is no abdominal tenderness. There is no guarding or rebound.  Musculoskeletal:        General: No swelling.     Cervical back: Neck supple.  Skin:    General: Skin is warm and dry.     Capillary Refill: Capillary refill takes less than 2 seconds.     Coloration: Skin is not pale.     Findings: Rash present.  Neurological:     Motor: No weakness.  Psychiatric:        Behavior: Behavior is agitated and aggressive.     ED Results / Procedures / Treatments   Labs (all labs ordered are listed, but only abnormal results are displayed) Labs Reviewed  CBC WITH DIFFERENTIAL/PLATELET - Abnormal; Notable for the following components:      Result Value   WBC 15.8 (*)    Neutro Abs 13.6 (*)    All other components within normal limits  COMPREHENSIVE METABOLIC PANEL - Abnormal; Notable for the following components:   Potassium 2.9 (*)  CO2 19 (*)    Glucose, Bld 102 (*)    Creatinine, Ser 2.38 (*)    GFR, Estimated 32 (*)    Anion gap 16 (*)    All other components within normal limits  LACTIC ACID, PLASMA - Abnormal; Notable for the following components:   Lactic Acid, Venous 3.6 (*)    All other components within normal limits  SALICYLATE LEVEL - Abnormal; Notable for the following components:   Salicylate Lvl <7.0 (*)    All other components within normal limits  AMMONIA - Abnormal; Notable for the following components:   Ammonia 62 (*)    All other components within normal limits  ACETAMINOPHEN LEVEL - Abnormal; Notable for the following components:   Acetaminophen (Tylenol), Serum <10 (*)    All other components within normal limits  LACTIC ACID, PLASMA  CK  ETHANOL  RAPID URINE  DRUG SCREEN, HOSP PERFORMED  URINALYSIS, ROUTINE W REFLEX MICROSCOPIC  SPOTTED FEVER GROUP ANTIBODIES    EKG EKG Interpretation Date/Time:  Thursday January 25 2023 07:28:47 EDT Ventricular Rate:  107 PR Interval:  127 QRS Duration:  101 QT Interval:  420 QTC Calculation: 561 R Axis:   68  Text Interpretation: Sinus tachycardia Prolonged QT interval when compared to prior, longer QTc. No STEMI Confirmed by Theda Belfast ([id number]) on 01/25/2023 8:01:36 AM  Radiology MR BRAIN WO CONTRAST  Result Date: 01/25/2023 CLINICAL DATA:  Provided history: Mental status change, unknown cause. Altered mental status. Agitation. EXAM: MRI HEAD WITHOUT CONTRAST TECHNIQUE: Multiplanar, multiecho pulse sequences of the brain and surrounding structures were obtained without intravenous contrast. COMPARISON:  Head CT 01/25/2023. FINDINGS: Intermittently motion degraded examination. Most notably, there is severe motion degradation of the sagittal T1 sequence, moderate/severe motion degradation of the axial T2 sequence, moderate/severe motion degradation of the axial T2 FLAIR sequence and moderate/severe motion degradation of the coronal T2 sequence. Within this limitation, findings are as follows. Brain: No age advanced or lobar predominant parenchymal atrophy. Possible punctate chronic microhemorrhage within the medial left cerebellar hemisphere (series 7, image 9). There is no acute infarct. No evidence of an intracranial mass. No chronic intracranial blood products. No extra-axial fluid collection. No midline shift. Vascular: Maintained flow voids within the proximal large arterial vessels. Skull and upper cervical spine: No focal suspicious marrow lesion. Sinuses/Orbits: No mass or acute finding within the imaged orbits. Minimal mucosal thickening and small mucous retention cyst within the right maxillary sinus. Minimal mucosal thickening versus small mucous retention cyst within the inferior left maxillary sinus.  Trace mucosal thickening within the left frontal sinus. IMPRESSION: 1. Significantly motion degraded examination. 2. Within this limitation, there is no evidence of an acute intracranial abnormality. The diffusion-weighted imaging is of good quality and there is no evidence of an acute infarct. 3. Possible punctate chronic microhemorrhage within the left cerebellar hemisphere. 4. Minor paranasal sinus disease as described. Electronically Signed   By: Jackey Loge D.O.   On: 01/25/2023 11:02   DG Abdomen 1 View  Result Date: 01/25/2023 CLINICAL DATA:  Evaluation for MRI EXAM: ABDOMEN - 1 VIEW COMPARISON:  CT abdomen and pelvis dated 01/08/2018 FINDINGS: The left lateral most abdomen and bilateral most and inferior pelvis are not included within the field of view. Nonobstructive bowel gas pattern. Partially imaged curvilinear radiodensity projecting over the lateral right iliac wing is likely external to the patient. IMPRESSION: 1. Partially imaged curvilinear radiodensity projecting over the lateral right iliac wing is likely external to the patient and would  be amenable to direct inspection. 2. Otherwise, no unexpected metallic foreign body within the imaged abdomen and pelvis. Electronically Signed   By: Agustin Cree M.D.   On: 01/25/2023 10:08   CT HEAD WO CONTRAST ( )  Result Date: 01/25/2023 CLINICAL DATA:  Altered mental status, psychosis, delirium, agitation. EXAM: CT HEAD WITHOUT CONTRAST TECHNIQUE: Contiguous axial images were obtained from the base of the skull through the vertex without intravenous contrast. RADIATION DOSE REDUCTION: This exam was performed according to the departmental dose-optimization program which includes automated exposure control, adjustment of the mA and/or kV according to patient size and/or use of iterative reconstruction technique. COMPARISON:  None Available. FINDINGS: Brain: There is no acute intracranial hemorrhage, extra-axial fluid collection, or acute territorial  infarct. Is possible hypodensity in the posterior limb of the left internal capsule which could reflect an age-indeterminate infarct. Parenchymal volume is normal. The ventricles are normal in size. Gray-white differentiation is preserved The pituitary and suprasellar region are normal. There is no mass lesion. There is no mass effect or midline shift. Vascular: No hyperdense vessel or unexpected calcification. Skull: Normal. Negative for fracture or focal lesion. Sinuses/Orbits: The paranasal sinuses are clear. Globes and orbits are unremarkable. Other: The mastoid air cells and middle ear cavities are clear. IMPRESSION: Possible age-indeterminate infarct in the posterior limb of the left internal capsule. Consider brain MRI for further evaluation. Electronically Signed   By: Lesia Hausen M.D.   On: 01/25/2023 09:36   DG Chest Portable 1 View  Result Date: 01/25/2023 CLINICAL DATA:  Altered, psychosis, tachycardia. EXAM: PORTABLE CHEST 1 VIEW COMPARISON:  Acute abdominal series 12/04/2019. FINDINGS: Low lung volumes accentuate the pulmonary vasculature and cardiomediastinal silhouette. No consolidation or pulmonary edema. No pleural effusion or pneumothorax. IMPRESSION: Low lung volumes without evidence of acute cardiopulmonary disease. Electronically Signed   By: Orvan Falconer M.D.   On: 01/25/2023 08:14    Procedures Procedures    CRITICAL CARE Performed by: Canary Brim Terryn Redner Total critical care time: 35 minutes Critical care time was exclusive of separately billable procedures and treating other patients. Critical care was necessary to treat or prevent imminent or life-threatening deterioration. Critical care was time spent personally by me on the following activities: development of treatment plan with patient and/or surrogate as well as nursing, discussions with consultants, evaluation of patient's response to treatment, examination of patient, obtaining history from patient or surrogate,  ordering and performing treatments and interventions, ordering and review of laboratory studies, ordering and review of radiographic studies, pulse oximetry and re-evaluation of patient's condition.  Medications Ordered in ED Medications  potassium chloride 10 mEq in 100 mL IVPB (10 mEq Intravenous New Bag/Given 01/25/23 1101)  sodium chloride 0.9 % bolus 1,000 mL (0 mLs Intravenous Stopped 01/25/23 0923)  LORazepam (ATIVAN) injection 2 mg (2 mg Intravenous Given 01/25/23 0741)  sodium chloride 0.9 % bolus 1,000 mL (1,000 mLs Intravenous New Bag/Given 01/25/23 1100)    ED Course/ Medical Decision Making/ A&P                             Medical Decision Making Amount and/or Complexity of Data Reviewed Labs: ordered. Radiology: ordered.  Risk Prescription drug management. Decision regarding hospitalization.    STEELE STRACENER is a 52 y.o. male with a past medical history significant for alcohol abuse, esophageal reflux, and documentation of chronic abdominal pain as well as low enforcement report of amphetamine use who presents for acute  psychosis.  According to law enforcement, patient was running down the road nearly naked screaming about demons and spirits.  They were concerned about his safety as he was running in the street that he could get hit by car.  Patient was very agitated and had to be restrained and sedated with ketamine during transport with EMS.  Patient is vigorously moving all extremities against restraints and quite agitated.  He is not answering questions.  There was no reported trauma although he has a skin tear to his right elbow from banging the edge of the ambulance.  There is no reported trauma getting hit by car.  There is no other known history and patient is unable to answer questions.  Due to patient's screaming about demons and spirits I am concerned about acute psychosis and delirium.  With this long for his report of previous drug use, this may have been a  precipitant.  Will get UDS and labs.  Will get head CT and other workup.  Will give some fluids.  As he was running on the street naked, will make sure he does not have rhabdo and check a CK.  Patient does have a very faint rash on his skin, will get RMSF test ordered although will not empirically treat as this looks more like a viral rash.  Patient pupils are symmetric and reactive and he would not follow commands.  He was moving all extremities.  Was screaming intermittently.  Lungs were clear and chest nontender.  Abdomen tender.  He is cool to the touch.  His wet clothes were cut off and he will be given a warm blanket.  Anticipate reassessment after rehydration and medications.  Will give IV Ativan as his QTc was slightly prolonged on the show EKG, will hold on Geodon or Haldol initially.  Patient placed under IVC for acute agitation, psychosis, and dangerous behavior running down the street.  Anticipate reevaluation after workup.  9:41 AM Workup beginning to return.  Patient has evidence of AKI so we will give fluids.  Potassium 2.9 will give IV potassium as she is unable to follow commands and take oral potassium.  CT head showed age-indeterminate infarct MRI recommended.  Will get MRI.  Patient is still asleep from medications. Ammonia slightly elevated.  Initial RMSF test was canceled by the lab, will reorder a different one.  Patient will get MRI.  Anticipate admission for the psychosis, altered mental status, and dehydration with AKI and electrolyte abnormalities.  11:15 AM MRI returned without evidence of acute stroke.  Patient's altered from the medications.  He intermittently is however.  IV potassium ordered for his hypokalemia.  X-ray did not show pneumonia.  Lactic acid is elevated, will give fluids and trend.  CK not elevated.  Patient was hypoxic with oxygen saturations in the upper 80s.  Will call for admission for altered mental status, mild hypoxia, and acute  agitation/psychosis requiring sedating medications for safety.  Anticipate TTS consultation when patient is able to wake up and answer questions.  Medicine called for admission.  Anticipate TTS consultation after patient is able to answer questions better.        Final Clinical Impression(s) / ED Diagnoses Final diagnoses:  Psychosis, unspecified psychosis type (HCC)  Hypoxia  Altered mental status, unspecified altered mental status type  Involuntary commitment     Clinical Impression: 1. Psychosis, unspecified psychosis type (HCC)   2. Hypoxia   3. Altered mental status, unspecified altered mental status type   4.  Involuntary commitment     Disposition: Admit  This note was prepared with assistance of Dragon voice recognition software. Occasional wrong-word or sound-a-like substitutions may have occurred due to the inherent limitations of voice recognition software.     Adda Stokes, Canary Brim, MD 01/25/23 1155

## 2023-01-25 NOTE — ED Notes (Signed)
IVC docs given to discharging/admitting RN.

## 2023-01-25 NOTE — ED Notes (Signed)
PA Schuman notified face to face to get EDP Adela Lank to bedside for patient evaluation

## 2023-01-25 NOTE — ED Notes (Signed)
Date and time results received: 01/25/23 8:25 AM  (use smartphrase ".now" to insert current time)  Test: Lactic acid   Critical Value: 3.6  Name of Provider Notified: Dr. Rush Landmark  Orders Received? Or Actions Taken?:  No new orders at this time

## 2023-01-25 NOTE — Progress Notes (Signed)
MB arrived to room to hook up EEG to find Pt extremely combative, loud, threatening and uncooperative. Currently approximally 6 Nurses by Bedside to care for, protect, and deal with the Patient. MB chat messaged the ordering Dr. to inform him that patient was NOT going to allow EEG at this time.

## 2023-01-25 NOTE — Progress Notes (Signed)
Son, braxton Raygoza, called father's phone looking for pt. Braxton notified his father is in the hospital due to acute psychosis. Son concerned about father's condition and potential drug abuse. Son spoke with pt briefly.

## 2023-01-25 NOTE — H&P (Signed)
Date: 01/25/2023               Patient Name:  Ian Roy MRN: [id number]  DOB: 1971-06-15 Age / Sex: 52 y.o., male   PCP: Pcp, No         Medical Service: Internal Medicine Teaching Service         Attending Physician: Dr. Dickie La, MD       First Contact: Annett Fabian, MD        Pager: Lurlean Nanny [phone]        Second Contact: Elza Rafter, DO    Pager: [phone]    Chief Complaint: Agitation  History of Present Illness:  Patient is a 52 year old male with past medical hx of polysubstance use presenting to the ED with acute psychosis. He was found on the street running around and brought in by the police given concern for self harm. He was given ketamine and placed in restraints given his violent behavior. No hx could be obtained from the patient but he was following simple commands intermittently.   Review of Systems negative unless stated in the HPI.  In the ED, imaging was negative for acute injury and MRI negative for stroke. Labs showed elevated LA, AKI and IMTS was consulted for admission.    Past Medical History: Past Medical History:  Diagnosis Date   Alcohol use    daily   Bronchitis    Chronic abdominal pain    GERD (gastroesophageal reflux disease)    Nausea and vomiting    recurrent    Meds: Current Outpatient Medications  Medication Instructions   omeprazole (PRILOSEC) 20 mg, Oral, Daily    Allergies: Allergies as of 01/25/2023   (No Known Allergies)    Past Surgical History: Past Surgical History:  Procedure Laterality Date   colonoscopy     COLONOSCOPY WITH PROPOFOL N/A 04/01/2018   Procedure: COLONOSCOPY WITH PROPOFOL;  Surgeon: Corbin Ade, MD;  Location: [facility];  Service: Endoscopy;  Laterality: N/A;  9:15am   None     POLYPECTOMY  04/01/2018   Procedure: POLYPECTOMY;  Surgeon: Corbin Ade, MD;  Location: [facility];  Service: Endoscopy;;    Family History:  Family History  Problem Relation Age  of Onset   Cancer Mother        breast   Diabetes Mother    Colon polyps Father    Cancer Maternal Aunt    Colon cancer Neg Hx     Social History:  unable to obtain from pt  Physical Exam: Blood pressure (!) 108/44, pulse 81, temperature 98.3 F (36.8 C), resp. rate 17, SpO2 98%. General: lethargic, opens eyes to command but closes them quickly afterwards HENT: NCAT, dry MM Lungs: CTAB Cardiovascular: tachycardic, normal heart sounds, good pulses in extremities  Abdomen: bowel sounds present MSK: good muscle bulk and tone Skin: mild rash present on skin in nonspecific pattern, present in pelvic area and in distal lower extremities. No lesions on upper extremity Neuro: unable to assess Psych: unable to assess  Diagnostics:     Latest Ref Rng & Units 01/25/2023    7:34 AM 06/02/2021    1:00 PM 05/04/2020    7:42 PM  CBC  WBC 4.0 - 10.5 K/uL 15.8  15.7  11.9   Hemoglobin 13.0 - 17.0 g/dL 13.0  86.5  78.4   Hematocrit 39.0 - 52.0 % 46.7  49.6  45.7   Platelets 150 - 400 K/uL  193  328  413        Latest Ref Rng & Units 01/25/2023    7:34 AM 06/02/2021    1:00 PM 05/30/2021   10:41 AM  CMP  Glucose 70 - 99 mg/dL 161  096  96   BUN 6 - 20 mg/dL 18  16  18    Creatinine 0.61 - 1.24 mg/dL 0.45  4.09  8.11   Sodium 135 - 145 mmol/L 142  140  136   Potassium 3.5 - 5.1 mmol/L 2.9  4.0  5.0   Chloride 98 - 111 mmol/L 107  102  102   CO2 22 - 32 mmol/L 19  29  29    Calcium 8.9 - 10.3 mg/dL 91.4  9.4  9.6   Total Protein 6.5 - 8.1 g/dL 7.5  7.3    Total Bilirubin 0.3 - 1.2 mg/dL 0.8  0.6    Alkaline Phos 38 - 126 U/L 52  48    AST 15 - 41 U/L 27  18    ALT 0 - 44 U/L 28  26      MR BRAIN WO CONTRAST  Result Date: 01/25/2023 CLINICAL DATA:  Provided history: Mental status change, unknown cause. Altered mental status. Agitation. EXAM: MRI HEAD WITHOUT CONTRAST IMPRESSION: 1. Significantly motion degraded examination. 2. Within this limitation, there is no evidence of an acute  intracranial abnormality. The diffusion-weighted imaging is of good quality and there is no evidence of an acute infarct. 3. Possible punctate chronic microhemorrhage within the left cerebellar hemisphere. 4. Minor paranasal sinus disease as described. Electronically Signed   By: Jackey Loge D.O.   On: 01/25/2023 11:02   DG Abdomen 1 View  Result Date: 01/25/2023 CLINICAL DATA:  Evaluation for MRI EXAM: ABDOMEN - 1 VIEW COMPARISON:  CT abdomen and pelvis dated 01/08/2018  IMPRESSION: 1. Partially imaged curvilinear radiodensity projecting over the lateral right iliac wing is likely external to the patient and would be amenable to direct inspection. 2. Otherwise, no unexpected metallic foreign body within the imaged abdomen and pelvis. Electronically Signed   By: Agustin Cree M.D.   On: 01/25/2023 10:08   CT HEAD WO CONTRAST ( )  Result Date: 01/25/2023 CLINICAL DATA:  Altered mental status, psychosis, delirium, agitation. EXAM: CT HEAD WITHOUT CONTRAST TECHNIQUE IMPRESSION: Possible age-indeterminate infarct in the posterior limb of the left internal capsule. Consider brain MRI for further evaluation. Electronically Signed   By: Lesia Hausen M.D.   On: 01/25/2023 09:36   DG Chest Portable 1 View  Result Date: 01/25/2023 CLINICAL DATA:  Altered, psychosis, tachycardia. EXAM: PORTABLE CHEST 1 VIEW COMPARISON:  IMPRESSION: Low lung volumes without evidence of acute cardiopulmonary disease. Electronically Signed   By: Orvan Falconer M.D.   On: 01/25/2023 08:14     EKG: personally reviewed my interpretation is sinus tachycardia and prolonged QT interval. No ischemic changes  CXR: personally reviewed my interpretation is decreased lung volumes.  Assessment & Plan by Problem:  Present on Admission:  Acute psychosis (HCC)   Acute Psychosis UDS positive for amphetamines. On chart review no hx of alcohol use but has hx of polysubstance use. Presentation is consistent with acute psychosis in  setting of intoxication. Pt very combative, agitated, hypertensive suggestive of sympathetic surge. Pt was following some commands intermittently on my initial evaluation. Will order prn ativan and restraints for protection of the staff. But will allow him to wake up slowly. Will admit him to med tele given he has  acute intoxication and needs tele monitoring and has hypokalemia. Will check TSH. Pt currently protecting airway and satting 100 % on RA. If concern develops for airway protection, then we can involve PCCM.   AKI: Appears to be secondary to acute presentation and likely pre-renal given lactic acidosis. Will give IVF overnight and trend creatinine in the am. If not improving can consider urine studies.   Hypokalemia 2.9. Will check magnesium but suspect secondary to AKI and low intake. Will replete with IV potassium. Repeat check this pm.   GERD: Hold po medications now.   DVT prophx: Lovenox Diet: NPO Bowel: PRN Code:Full  Prior to Admission Living Arrangement: Home Anticipated Discharge Location: Home Barriers to Discharge: Medical Workup  Dispo: Admit patient to Inpatient with expected length of stay greater than 2 midnights.  Gwenevere Abbot, MD Eligha Bridegroom. [facility] Internal Medicine Residency, PGY-3 Pager: [phone] After 5 pm or weekends:  1st Contact: Pager: [phone]  2nd Contact: Pager: [phone]

## 2023-01-25 NOTE — ED Notes (Signed)
Security called to bedside; RN's and tech's at bedside

## 2023-01-25 NOTE — ED Notes (Signed)
ED TO INPATIENT HANDOFF REPORT  ED Nurse Name and Phone #: Tayden Duran [phone]  S Name/Age/Gender Ian Roy 52 y.o. male Room/Bed: 044C/044C  Code Status   Code Status: Full Code  Home/SNF/Other Unknown Patient oriented to: self Is this baseline? No   Triage Complete: Triage complete  Chief Complaint Acute psychosis (HCC) [F23]  Triage Note Pt arrives via [facility] after being found nearly naked in the street shouting about demons and spirits after him. Pt was violent with EMS and required ketamine IM and violent restraints. Pt was spitting at EMS.    Allergies No Known Allergies  Level of Care/Admitting Diagnosis ED Disposition     ED Disposition  Admit   Condition  --   Comment  Hospital Area: [facility] [[id number]]  Level of Care: Telemetry Medical [104]  May admit patient to [facility] or [facility] if equivalent level of care is available:: No  Covid Evaluation: Asymptomatic - no recent exposure (last 10 days) testing not required  Diagnosis: Acute psychosis [facility]) [[id number]]  Admitting Physician: Dickie La [[id number]]  Attending Physician: Dickie La [[id number]]  Certification:: I certify this patient will need inpatient services for at least 2 midnights  Estimated Length of Stay: 3          B Medical/Surgery History Past Medical History:  Diagnosis Date   Alcohol use    daily   Bronchitis    Chronic abdominal pain    GERD (gastroesophageal reflux disease)    Nausea and vomiting    recurrent   Past Surgical History:  Procedure Laterality Date   colonoscopy     COLONOSCOPY WITH PROPOFOL N/A 04/01/2018   Procedure: COLONOSCOPY WITH PROPOFOL;  Surgeon: Corbin Ade, MD;  [facility];  Service: Endoscopy;  Laterality: N/A;  9:15am   None     POLYPECTOMY  04/01/2018   Procedure: POLYPECTOMY;  Surgeon: Corbin Ade, MD;  [facility]: [facility];  Service: Endoscopy;;     A IV Location/Drains/Wounds Patient  Lines/Drains/Airways Status     Active Line/Drains/Airways     Name Placement date Placement time Site Days   Peripheral IV 01/25/23 20 G Anterior;Left;Proximal Forearm 01/25/23  0724  Forearm  less than 1   External Urinary Catheter 01/25/23  0804  --  less than 1            Intake/Output Last 24 hours  Intake/Output Summary (Last 24 hours) at 01/25/2023 1452 Last data filed at 01/25/2023 0804 Gross per 24 hour  Intake --  Output 0 ml  Net 0 ml    Labs/Imaging Results for orders placed or performed during the hospital encounter of 01/25/23 (from the past 48 hour(s))  Lactic acid, plasma     Status: Abnormal   Collection Time: 01/25/23  7:24 AM  Result Value Ref Range   Lactic Acid, Venous 3.6 (HH) 0.5 - 1.9 mmol/L    Comment: CRITICAL RESULT CALLED TO, READ BACK BY AND VERIFIED WITH B.MONTEE,RN 0824 01/25/23 CLARK,S Performed at [facility], [address], [place], [place] [address]   Ethanol     Status: None   Collection Time: 01/25/23  7:28 AM  Result Value Ref Range   Alcohol, Ethyl (B) <10 <10 mg/dL    Comment: (NOTE) Lowest detectable limit for serum alcohol is 10 mg/dL.  For medical purposes only. Performed at [facility], [address] [place], [place] [address]   Salicylate level  Status: Abnormal   Collection Time: 01/25/23  7:28 AM  Result Value Ref Range   Salicylate Lvl <7.0 (L) 7.0 - 30.0 mg/dL    Comment: Performed at [facility], [address], [place], [place] [address]  Ammonia     Status: Abnormal   Collection Time: 01/25/23  7:28 AM  Result Value Ref Range   Ammonia 62 (H) 9 - 35 umol/L    Comment: Performed at [facility], [address] [place], [place] [address]  Acetaminophen level     Status: Abnormal   Collection Time: 01/25/23  7:28 AM  Result Value Ref Range   Acetaminophen (Tylenol), Serum <10 (L) 10 - 30 ug/mL    Comment: (NOTE) Therapeutic concentrations vary significantly. A range of 10-30  ug/mL  may be an effective concentration for many patients. However, some  are best treated at concentrations outside of this range. Acetaminophen concentrations >150 ug/mL at 4 hours after ingestion  and >50 ug/mL at 12 hours after ingestion are often associated with  toxic reactions.  Performed at [facility], [address], [place], [place] [address]   CBC with Differential     Status: Abnormal   Collection Time: 01/25/23  7:34 AM  Result Value Ref Range   WBC 15.8 (H) 4.0 - 10.5 K/uL   RBC 5.29 4.22 - 5.81 MIL/uL   Hemoglobin 15.9 13.0 - 17.0 g/dL   HCT 02.7 25.3 - 66.4 %   MCV 88.3 80.0 - 100.0 fL   MCH 30.1 26.0 - 34.0 pg   MCHC 34.0 30.0 - 36.0 g/dL   RDW 40.3 47.4 - 25.9 %   Platelets 193 150 - 400 K/uL    Comment: REPEATED TO VERIFY   nRBC 0.0 0.0 - 0.2 %   Neutrophils Relative % 87 %   Neutro Abs 13.6 (H) 1.7 - 7.7 K/uL   Lymphocytes Relative 7 %   Lymphs Abs 1.1 0.7 - 4.0 K/uL   Monocytes Relative 5 %   Monocytes Absolute 0.8 0.1 - 1.0 K/uL   Eosinophils Relative 1 %   Eosinophils Absolute 0.1 0.0 - 0.5 K/uL   Basophils Relative 0 %   Basophils Absolute 0.1 0.0 - 0.1 K/uL   Immature Granulocytes 0 %   Abs Immature Granulocytes 0.06 0.00 - 0.07 K/uL    Comment: Performed at [facility], [address] [place], [place] [address]  Comprehensive metabolic panel     Status: Abnormal   Collection Time: 01/25/23  7:34 AM  Result Value Ref Range   Sodium 142 135 - 145 mmol/L   Potassium 2.9 (L) 3.5 - 5.1 mmol/L   Chloride 107 98 - 111 mmol/L   CO2 19 (L) 22 - 32 mmol/L   Glucose, Bld 102 (H) 70 - 99 mg/dL    Comment: Glucose reference range applies only to samples taken after fasting for at least 8 hours.   BUN 18 6 - 20 mg/dL   Creatinine, Ser 5.64 (H) 0.61 - 1.24 mg/dL   Calcium 33.2 8.9 - 95.1 mg/dL   Total Protein 7.5 6.5 - 8.1 g/dL   Albumin 4.4 3.5 - 5.0 g/dL   AST 27 15 - 41 U/L   ALT 28 0 - 44 U/L   Alkaline Phosphatase 52 38 - 126 U/L    Total Bilirubin 0.8 0.3 - 1.2 mg/dL   GFR, Estimated 32 (L) >60 mL/min    Comment: (NOTE) Calculated using the CKD-EPI  Creatinine Equation (2021)    Anion gap 16 (H) 5 - 15    Comment: Performed at [facility], [address] [place], [place] [address]  CK     Status: None   Collection Time: 01/25/23  7:34 AM  Result Value Ref Range   Total CK 314 49 - 397 U/L    Comment: Performed at [facility], [address] [place], [place] [address]  Lactic acid, plasma     Status: None   Collection Time: 01/25/23  9:30 AM  Result Value Ref Range   Lactic Acid, Venous 0.9 0.5 - 1.9 mmol/L    Comment: Performed at [facility], [address] [place], [place] [address]   MR BRAIN WO CONTRAST  Result Date: 01/25/2023 CLINICAL DATA:  Provided history: Mental status change, unknown cause. Altered mental status. Agitation. EXAM: MRI HEAD WITHOUT CONTRAST TECHNIQUE: Multiplanar, multiecho pulse sequences of the brain and surrounding structures were obtained without intravenous contrast. COMPARISON:  Head CT 01/25/2023. FINDINGS: Intermittently motion degraded examination. Most notably, there is severe motion degradation of the sagittal T1 sequence, moderate/severe motion degradation of the axial T2 sequence, moderate/severe motion degradation of the axial T2 FLAIR sequence and moderate/severe motion degradation of the coronal T2 sequence. Within this limitation, findings are as follows. Brain: No age advanced or lobar predominant parenchymal atrophy. Possible punctate chronic microhemorrhage within the medial left cerebellar hemisphere (series 7, image 9). There is no acute infarct. No evidence of an intracranial mass. No chronic intracranial blood products. No extra-axial fluid collection. No midline shift. Vascular: Maintained flow voids within the proximal large arterial vessels. Skull and upper cervical spine: No focal suspicious marrow lesion. Sinuses/Orbits: No mass or acute  finding within the imaged orbits. Minimal mucosal thickening and small mucous retention cyst within the right maxillary sinus. Minimal mucosal thickening versus small mucous retention cyst within the inferior left maxillary sinus. Trace mucosal thickening within the left frontal sinus. IMPRESSION: 1. Significantly motion degraded examination. 2. Within this limitation, there is no evidence of an acute intracranial abnormality. The diffusion-weighted imaging is of good quality and there is no evidence of an acute infarct. 3. Possible punctate chronic microhemorrhage within the left cerebellar hemisphere. 4. Minor paranasal sinus disease as described. Electronically Signed   By: Jackey Loge D.O.   On: 01/25/2023 11:02   DG Abdomen 1 View  Result Date: 01/25/2023 CLINICAL DATA:  Evaluation for MRI EXAM: ABDOMEN - 1 VIEW COMPARISON:  CT abdomen and pelvis dated 01/08/2018 FINDINGS: The left lateral most abdomen and bilateral most and inferior pelvis are not included within the field of view. Nonobstructive bowel gas pattern. Partially imaged curvilinear radiodensity projecting over the lateral right iliac wing is likely external to the patient. IMPRESSION: 1. Partially imaged curvilinear radiodensity projecting over the lateral right iliac wing is likely external to the patient and would be amenable to direct inspection. 2. Otherwise, no unexpected metallic foreign body within the imaged abdomen and pelvis. Electronically Signed   By: Agustin Cree M.D.   On: 01/25/2023 10:08   CT HEAD WO CONTRAST ( )  Result Date: 01/25/2023 CLINICAL DATA:  Altered mental status, psychosis, delirium, agitation. EXAM: CT HEAD WITHOUT CONTRAST TECHNIQUE: Contiguous axial images were obtained from the base of the skull through the vertex without intravenous contrast. RADIATION DOSE REDUCTION: This exam was performed according to the departmental dose-optimization program which includes automated exposure control, adjustment of the  mA and/or kV according to patient size  and/or use of iterative reconstruction technique. COMPARISON:  None Available. FINDINGS: Brain: There is no acute intracranial hemorrhage, extra-axial fluid collection, or acute territorial infarct. Is possible hypodensity in the posterior limb of the left internal capsule which could reflect an age-indeterminate infarct. Parenchymal volume is normal. The ventricles are normal in size. Gray-white differentiation is preserved The pituitary and suprasellar region are normal. There is no mass lesion. There is no mass effect or midline shift. Vascular: No hyperdense vessel or unexpected calcification. Skull: Normal. Negative for fracture or focal lesion. Sinuses/Orbits: The paranasal sinuses are clear. Globes and orbits are unremarkable. Other: The mastoid air cells and middle ear cavities are clear. IMPRESSION: Possible age-indeterminate infarct in the posterior limb of the left internal capsule. Consider brain MRI for further evaluation. Electronically Signed   By: Lesia Hausen M.D.   On: 01/25/2023 09:36   DG Chest Portable 1 View  Result Date: 01/25/2023 CLINICAL DATA:  Altered, psychosis, tachycardia. EXAM: PORTABLE CHEST 1 VIEW COMPARISON:  Acute abdominal series 12/04/2019. FINDINGS: Low lung volumes accentuate the pulmonary vasculature and cardiomediastinal silhouette. No consolidation or pulmonary edema. No pleural effusion or pneumothorax. IMPRESSION: Low lung volumes without evidence of acute cardiopulmonary disease. Electronically Signed   By: Orvan Falconer M.D.   On: 01/25/2023 08:14    Pending Labs Unresulted Labs (From admission, onward)     Start     Ordered   01/25/23 1800  Basic metabolic panel  Once,   R        01/25/23 1339   01/25/23 1334  TSH  Add-on,   AD        01/25/23 1339   01/25/23 1329  HIV Antibody (routine testing w rflx)  (HIV Antibody (Routine testing w reflex) panel)  Once,   R        01/25/23 1339   01/25/23 0945  Spotted Fever  Group Antibodies  Once,   URGENT        01/25/23 0944   01/25/23 0724  Rapid urine drug screen (hospital performed)  ONCE - STAT,   STAT        01/25/23 0727   01/25/23 0724  Urinalysis, Routine w reflex microscopic -Urine, Clean Catch  Once,   URGENT       Question:  Specimen Source  Answer:  Urine, Clean Catch   01/25/23 0727            Vitals/Pain Today's Vitals   01/25/23 1215 01/25/23 1230 01/25/23 1245 01/25/23 1300  BP: 109/66 104/63 109/63 (!) 108/44  Pulse: 84 79 79 81  Resp: (!) 24 19 17 17   Temp:      TempSrc:      SpO2: 99% 100% 100% 98%    Isolation Precautions No active isolations  Medications Medications  enoxaparin (LOVENOX) injection 30 mg (30 mg Subcutaneous Not Given 01/25/23 1418)  acetaminophen (TYLENOL) tablet 650 mg (has no administration in time range)    Or  acetaminophen (TYLENOL) suppository 650 mg (has no administration in time range)  senna-docusate (Senokot-S) tablet 1 tablet (has no administration in time range)  potassium chloride 10 mEq in 100 mL IVPB (10 mEq Intravenous Not Given 01/25/23 1445)  sodium chloride 0.9 % bolus 1,000 mL (0 mLs Intravenous Stopped 01/25/23 0923)  LORazepam (ATIVAN) injection 2 mg (2 mg Intravenous Given 01/25/23 0741)  sodium chloride 0.9 % bolus 1,000 mL (0 mLs Intravenous Stopped 01/25/23 1359)  potassium chloride 10 mEq in 100 mL IVPB (0 mEq Intravenous Stopped 01/25/23  1359)  LORazepam (ATIVAN) injection 2 mg (2 mg Intravenous Given 01/25/23 1446)    Mobility walks     Focused Assessments    R Recommendations: See Admitting Provider Note  Report given to:   Additional Notes:

## 2023-01-25 NOTE — ED Notes (Signed)
IVC paperwork completed attached to clipboard in orange zone Case number:[id number] Exp:02-01-2023

## 2023-01-26 DIAGNOSIS — R4182 Altered mental status, unspecified: Secondary | ICD-10-CM

## 2023-01-26 DIAGNOSIS — N179 Acute kidney failure, unspecified: Secondary | ICD-10-CM

## 2023-01-26 DIAGNOSIS — Z046 Encounter for general psychiatric examination, requested by authority: Secondary | ICD-10-CM

## 2023-01-26 DIAGNOSIS — F23 Brief psychotic disorder: Secondary | ICD-10-CM

## 2023-01-26 LAB — GLUCOSE, CAPILLARY
Glucose-Capillary: 91 mg/dL (ref 70–99)
Glucose-Capillary: 102 mg/dL — ABNORMAL HIGH (ref 70–99)

## 2023-01-26 LAB — CK
Total CK: 6267 U/L — ABNORMAL HIGH (ref 49–397)
Total CK: 4483 U/L — ABNORMAL HIGH (ref 49–397)

## 2023-01-26 LAB — BASIC METABOLIC PANEL
Anion gap: 7 (ref 5–15)
BUN: 24 mg/dL — ABNORMAL HIGH (ref 6–20)
CO2: 21 mmol/L — ABNORMAL LOW (ref 22–32)
Calcium: 8.5 mg/dL — ABNORMAL LOW (ref 8.9–10.3)
Chloride: 112 mmol/L — ABNORMAL HIGH (ref 98–111)
Creatinine, Ser: 1.15 mg/dL (ref 0.61–1.24)
GFR, Estimated: >60 mL/min (ref >60)
Glucose, Bld: 61 mg/dL — ABNORMAL LOW (ref 70–99)
Potassium: 3.7 mmol/L (ref 3.5–5.1)
Sodium: 140 mmol/L (ref 135–145)

## 2023-01-26 MED ORDER — LACTATED RINGERS IV SOLN: INTRAVENOUS | Status: AC

## 2023-01-26 NOTE — Progress Notes (Addendum)
HD#1 SUBJECTIVE:  Patient Summary: Ian Roy is a 52 y.o. male with a pertinent PMH of polysubstance use who presented with and is admitted for acute psychosis.   Overnight Events: None  Interim History: Patient is in restraints at the time of exam. He does not report and current pain or new symptoms. He is oriented to person, place, and time, but cannot remember why he is in the hospital. When asked about his living situation, he became agitated and escalated the conversation.   OBJECTIVE:  Vital Signs: Vitals:   01/25/23 1940 01/25/23 2200 01/25/23 2300 01/26/23 0400  BP:  118/71 105/60 108/70  Pulse: 75 72 74 70  Resp: (!) 22 18 16 16   Temp: (!) 97.2 F (36.2 C)  97.6 F (36.4 C) 97.7 F (36.5 C)  TempSrc: Axillary  Axillary Axillary  SpO2: 96% 98% 99% 98%   Supplemental O2: Nasal Cannula SpO2: 98 % O2 Flow Rate (L/min): 2 L/min  There were no vitals filed for this visit.   Intake/Output Summary (Last 24 hours) at 01/26/2023 1129 Last data filed at 01/26/2023 2130 Gross per 24 hour  Intake 1559.93 ml  Output 600 ml  Net 959.93 ml   Net IO Since Admission: 959.93 mL [01/26/23 1129]  Physical Exam: General: sweaty, flushed skin, eyes remained closed Pulm: normal rate and effort Cardio: normal rate and rhythm Neuro: alert and oriented x 3 Psych: agitated; speech tangential and disorganized at times  Patient Lines/Drains/Airways Status     Active Line/Drains/Airways     Name Placement date Placement time Site Days   Peripheral IV 01/25/23 20 G Anterior;Left;Proximal Forearm 01/25/23  0724  Forearm  1   Peripheral IV 01/25/23 Anterior;Left Forearm 01/25/23  1554  Forearm  1   External Urinary Catheter 01/25/23  0804  --  1            Pertinent Labs:    Latest Ref Rng & Units 01/25/2023    7:34 AM 06/02/2021    1:00 PM 05/04/2020    7:42 PM  CBC  WBC 4.0 - 10.5 K/uL 15.8  15.7  11.9   Hemoglobin 13.0 - 17.0 g/dL 86.5  78.4  69.6   Hematocrit  39.0 - 52.0 % 46.7  49.6  45.7   Platelets 150 - 400 K/uL 193  328  413        Latest Ref Rng & Units 01/25/2023    5:14 PM 01/25/2023    7:34 AM 06/02/2021    1:00 PM  CMP  Glucose 70 - 99 mg/dL 83  295  284   BUN 6 - 20 mg/dL 22  18  16    Creatinine 0.61 - 1.24 mg/dL 1.32  4.40  1.02   Sodium 135 - 145 mmol/L 141  142  140   Potassium 3.5 - 5.1 mmol/L 3.7  2.9  4.0   Chloride 98 - 111 mmol/L 112  107  102   CO2 22 - 32 mmol/L 21  19  29    Calcium 8.9 - 10.3 mg/dL 8.6  72.5  9.4   Total Protein 6.5 - 8.1 g/dL  7.5  7.3   Total Bilirubin 0.3 - 1.2 mg/dL  0.8  0.6   Alkaline Phos 38 - 126 U/L  52  48   AST 15 - 41 U/L  27  18   ALT 0 - 44 U/L  28  26     Recent Labs    01/25/23 2223  GLUCAP 74    Urinalysis: moderate hgb, no pyuria or bacteriuria  Urine drug screen: positive for amphetamines  ASSESSMENT/PLAN:  Assessment: Principal Problem:   Acute psychosis (HCC) Active Problems:   Involuntary commitment   Altered mental status   AKI (acute kidney injury) (HCC)  Plan: Acute Psychosis UDS positive for amphetamines, so this episode of acute psychosis may be related to recent substance use. He was oriented this morning and seemed somewhat improved from prior, but his speech was still tangential and disorganized. He spoke about hearing voices with religious messages, but it was unclear whether he is still hearing such voices or when they originally began. We will consult Psychiatry and appreciate their assistance.  -Consulted Psychiatry -Ativan 1mg  q2h PRN  AKI Creatinine down to 1.58 today from 2.4, appears baseline is ~1. Received LR last night.  -Restarted regular diet; fluids discontinued  Best Practice: Diet: Regular diet IVF: None VTE: enoxaparin (LOVENOX) injection 30 mg Start: 01/25/23 1400 Code: Full AB: None Therapy Recs: None Family Contact: Braxton, son, to be notified. DISPO: Anticipated discharge Home pending psychiatric and medical  stability  Signature: Annett Fabian, MD  Internal Medicine Resident, PGY-1 [facility]  Pager: [phone] 11:29 AM, 01/26/2023   Please contact the on call pager after 5 pm and on weekends at [phone].

## 2023-01-26 NOTE — Consult Note (Signed)
[facility] Face-to-Face Psychiatry Consult   Reason for Consult:  Acute Psychosis Referring Physician: Elza Rafter MD  Patient Identification: Ian Roy MRN:  [id number] Principal Diagnosis: Acute psychosis (HCC) Diagnosis:  Principal Problem:   Acute psychosis (HCC) Active Problems:   Involuntary commitment   Altered mental status   AKI (acute kidney injury) (HCC)   Total Time spent with patient: 15 minutes  Subjective:   Ian Roy is a 52 y.o. male seen and evaluated face-to-face by this provider.  Psychiatric consult placed due to acute psychosis. Safety sitter at bedside.  Patient noted to be in soft restraints. [name] is requesting to discharge as he states he have to get back to work. 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  Patient is unable to recall the reason for this admission.  Stated that he was drinking Michelob beer with friends. He is denying another illicit drug use. Denied that he is followed by therapy or psychiatry currently. Stated that he was residing with his mother, however hasn't been back in about 2 to 3 months.   During evaluation Ian Roy is sitting upright eating lunch; he is alert/oriented x 3; calm/cooperative; and mood congruent with affect.  Patient is speaking in a clear tone at moderate volume, and normal pace; with fair eye contact.  His thought process is coherent, however conversation is linear.  Patient denies suicidal/self-harm/homicidal ideation. Patient has remained calm throughout assessment. Appear to be a poor historian. Patient remains focused on discharging.  Case was staffed and consulted with attending MD Ian Roy.   - Chart reviewed labs corrected on 7/18, Lactic Acid 0.9,  UDS + Amphetamines, Potassium 3.7, Creatinine is trending down form 2.38 to 1.58, GFR 53, CK 314, EKG 354/485.  Head CT  Finding" There is no acute intracranial hemorrhage, extra-axial fluid collection, or acute territorial infarct."    Pending Amnion Level  consider starting Depakote 500 mg po BID for mood stabilization and continue to monitor.  Continue Ativan for server agitation Psychiatry will continue to follow.   HPI:  Per admission and assessment note on admission; " 52 yo person with polysubstance use admitted in the setting of acute psychosis and abnormal behavior. IVC obtained in the ED. Vitals, labs, and imaging reviewed. UDS positive for amphetamines. Lactic acidosis, cleared. CK normal. Hypokalemia, improved. Elevated Cr, concerning for AKI--improving with fluids."  Past Psychiatric History:   Risk to Self:   Risk to Others:   Prior Inpatient Therapy:   Prior Outpatient Therapy:    Past Medical History:  Past Medical History:  Diagnosis Date   Alcohol use    daily   Bronchitis    Chronic abdominal pain    GERD (gastroesophageal reflux disease)    Nausea and vomiting    recurrent    Past Surgical History:  Procedure Laterality Date   colonoscopy     COLONOSCOPY WITH PROPOFOL N/A 04/01/2018   Procedure: COLONOSCOPY WITH PROPOFOL;  Surgeon: Corbin Ade, MD;  [facility];  Service: Endoscopy;  Laterality: N/A;  9:15am   None     POLYPECTOMY  04/01/2018   Procedure: POLYPECTOMY;  Surgeon: Corbin Ade, MD;  [facility]: [facility];  Service: Endoscopy;;   Family History:  Family History  Problem Relation Age of Onset   Cancer Mother        breast   Diabetes Mother    Colon polyps Father    Cancer Maternal Aunt    Colon  cancer Neg Hx    Family Psychiatric  History:  Social History:  Social History   Substance and Sexual Activity  Alcohol Use Not Currently   Comment: history heavy etoh. last drink july 2022     Social History   Substance and Sexual Activity  Drug Use Not Currently    Social History   Socioeconomic History   Marital status: Divorced    Spouse name: Not on file   Number of children: Not on file   Years of education: Not on file   Highest  education level: Not on file  Occupational History   Occupation: CNA    Comment: prn   Occupation: handyman  Tobacco Use   Smoking status: Every Day    Current packs/day: 1.00    Average packs/day: 1 pack/day for 14.0 years (14.0 ttl pk-yrs)    Types: Cigarettes   Smokeless tobacco: Never  Vaping Use   Vaping status: Never Used  Substance and Sexual Activity   Alcohol use: Not Currently    Comment: history heavy etoh. last drink july 2022   Drug use: Not Currently   Sexual activity: Not on file  Other Topics Concern   Not on file  Social History Narrative   Not on file   Social Determinants of Health   Financial Resource Strain: Not on file  Food Insecurity: Not on file  Transportation Needs: Not on file  Physical Activity: Not on file  Stress: Not on file  Social Connections: Not on file   Additional Social History:    Allergies:  No Known Allergies  Labs:  Results for orders placed or performed during the hospital encounter of 01/25/23 (from the past 48 hour(s))  Lactic acid, plasma     Status: Abnormal   Collection Time: 01/25/23  7:24 AM  Result Value Ref Range   Lactic Acid, Venous 3.6 (HH) 0.5 - 1.9 mmol/L    Comment: CRITICAL RESULT CALLED TO, READ BACK BY AND VERIFIED WITH B.MONTEE,RN 0824 01/25/23 CLARK,S Performed at [facility] Lab, [address] [place], [place] [address]   Ethanol     Status: None   Collection Time: 01/25/23  7:28 AM  Result Value Ref Range   Alcohol, Ethyl (B) <10 <10 mg/dL    Comment: (NOTE) Lowest detectable limit for serum alcohol is 10 mg/dL.  For medical purposes only. Performed at [facility] Lab, [address] [place], [place] [address]   Salicylate level     Status: Abnormal   Collection Time: 01/25/23  7:28 AM  Result Value Ref Range   Salicylate Lvl <7.0 (L) 7.0 - 30.0 mg/dL    Comment: Performed at [facility], [address] [place], [place] [address]  Ammonia     Status: Abnormal   Collection  Time: 01/25/23  7:28 AM  Result Value Ref Range   Ammonia 62 (H) 9 - 35 umol/L    Comment: Performed at [facility] Lab, [address], [place], [place] [address]  Acetaminophen level     Status: Abnormal   Collection Time: 01/25/23  7:28 AM  Result Value Ref Range   Acetaminophen (Tylenol), Serum <10 (L) 10 - 30 ug/mL    Comment: (NOTE) Therapeutic concentrations vary significantly. A range of 10-30 ug/mL  may be an effective concentration for many patients. However, some  are best treated at concentrations outside of this range. Acetaminophen concentrations >150 ug/mL at 4 hours after ingestion  and >50 ug/mL at  12 hours after ingestion are often associated with  toxic reactions.  Performed at [facility] Lab, [address], [place], [place] [address]   CBC with Differential     Status: Abnormal   Collection Time: 01/25/23  7:34 AM  Result Value Ref Range   WBC 15.8 (H) 4.0 - 10.5 K/uL   RBC 5.29 4.22 - 5.81 MIL/uL   Hemoglobin 15.9 13.0 - 17.0 g/dL   HCT 13.0 86.5 - 78.4 %   MCV 88.3 80.0 - 100.0 fL   MCH 30.1 26.0 - 34.0 pg   MCHC 34.0 30.0 - 36.0 g/dL   RDW 69.6 29.5 - 28.4 %   Platelets 193 150 - 400 K/uL    Comment: REPEATED TO VERIFY   nRBC 0.0 0.0 - 0.2 %   Neutrophils Relative % 87 %   Neutro Abs 13.6 (H) 1.7 - 7.7 K/uL   Lymphocytes Relative 7 %   Lymphs Abs 1.1 0.7 - 4.0 K/uL   Monocytes Relative 5 %   Monocytes Absolute 0.8 0.1 - 1.0 K/uL   Eosinophils Relative 1 %   Eosinophils Absolute 0.1 0.0 - 0.5 K/uL   Basophils Relative 0 %   Basophils Absolute 0.1 0.0 - 0.1 K/uL   Immature Granulocytes 0 %   Abs Immature Granulocytes 0.06 0.00 - 0.07 K/uL    Comment: Performed at [facility] Lab, [address], [place], [place] [address]  Comprehensive metabolic panel     Status: Abnormal   Collection Time: 01/25/23  7:34 AM  Result Value Ref Range   Sodium 142 135 - 145 mmol/L   Potassium 2.9 (L) 3.5 - 5.1 mmol/L   Chloride 107 98 - 111 mmol/L    CO2 19 (L) 22 - 32 mmol/L   Glucose, Bld 102 (H) 70 - 99 mg/dL    Comment: Glucose reference range applies only to samples taken after fasting for at least 8 hours.   BUN 18 6 - 20 mg/dL   Creatinine, Ser 0.10 (H) 0.61 - 1.24 mg/dL   Calcium 27.2 8.9 - 53.6 mg/dL   Total Protein 7.5 6.5 - 8.1 g/dL   Albumin 4.4 3.5 - 5.0 g/dL   AST 27 15 - 41 U/L   ALT 28 0 - 44 U/L   Alkaline Phosphatase 52 38 - 126 U/L   Total Bilirubin 0.8 0.3 - 1.2 mg/dL   GFR, Estimated 32 (L) >60 mL/min    Comment: (NOTE) Calculated using the CKD-EPI Creatinine Equation (2021)    Anion gap 16 (H) 5 - 15    Comment: Performed at [facility] Lab, [address], [place], [place] [address]  CK     Status: None   Collection Time: 01/25/23  7:34 AM  Result Value Ref Range   Total CK 314 49 - 397 U/L    Comment: Performed at [facility] Lab, [address], [place], [place] [address]  Lactic acid, plasma     Status: None   Collection Time: 01/25/23  9:30 AM  Result Value Ref Range   Lactic Acid, Venous 0.9 0.5 - 1.9 mmol/L    Comment: Performed at [facility] Lab, [address] [place], [place] [address]  Rapid urine drug screen (hospital performed)     Status: Abnormal   Collection Time: 01/25/23  2:20 PM  Result Value Ref Range   Opiates NONE DETECTED NONE DETECTED   Cocaine NONE DETECTED NONE DETECTED   Benzodiazepines NONE DETECTED  NONE DETECTED   Amphetamines POSITIVE (A) NONE DETECTED   Tetrahydrocannabinol NONE DETECTED NONE DETECTED   Barbiturates NONE DETECTED NONE DETECTED    Comment: (NOTE) DRUG SCREEN FOR MEDICAL PURPOSES ONLY.  IF CONFIRMATION IS NEEDED FOR ANY PURPOSE, NOTIFY LAB WITHIN 5 DAYS.  LOWEST DETECTABLE LIMITS FOR URINE DRUG SCREEN Drug Class                     Cutoff (ng/mL) Amphetamine and metabolites    1000 Barbiturate and metabolites    200 Benzodiazepine                 200 Opiates and metabolites        300 Cocaine and metabolites        300 THC                             50 Performed at [facility] Lab, [address] [place], [place] [address]   Urinalysis, Routine w reflex microscopic -Urine, Clean Catch     Status: Abnormal   Collection Time: 01/25/23  2:20 PM  Result Value Ref Range   Color, Urine AMBER (A) YELLOW    Comment: BIOCHEMICALS MAY BE AFFECTED BY COLOR   APPearance HAZY (A) CLEAR   Specific Gravity, Urine 1.027 1.005 - 1.030   pH 5.0 5.0 - 8.0   Glucose, UA NEGATIVE NEGATIVE mg/dL   Hgb urine dipstick MODERATE (A) NEGATIVE   Bilirubin Urine SMALL (A) NEGATIVE   Ketones, ur 5 (A) NEGATIVE mg/dL   Protein, ur 469 (A) NEGATIVE mg/dL   Nitrite NEGATIVE NEGATIVE   Leukocytes,Ua NEGATIVE NEGATIVE   RBC / HPF 0-5 0 - 5 RBC/hpf   WBC, UA 0-5 0 - 5 WBC/hpf   Bacteria, UA NONE SEEN NONE SEEN   Squamous Epithelial / HPF 0-5 0 - 5 /HPF   Mucus PRESENT    Hyaline Casts, UA PRESENT    Sperm, UA PRESENT     Comment: Performed at [facility] Lab, [address] [place], [place] [address]  HIV Antibody (routine testing w rflx)     Status: None   Collection Time: 01/25/23  5:14 PM  Result Value Ref Range   HIV Screen 4th Generation wRfx Non Reactive Non Reactive    Comment: Performed at [facility] Lab, [address] [place], [place] [address]  TSH     Status: None   Collection Time: 01/25/23  5:14 PM  Result Value Ref Range   TSH 0.839 0.350 - 4.500 uIU/mL    Comment: Performed by a 3rd Generation assay with a functional sensitivity of <=0.01 uIU/mL. Performed at [facility] Lab, [address], [place], [place] [address]   Basic metabolic panel     Status: Abnormal   Collection Time: 01/25/23  5:14 PM  Result Value Ref Range   Sodium 141 135 - 145 mmol/L   Potassium 3.7 3.5 - 5.1 mmol/L   Chloride 112 (H) 98 - 111 mmol/L   CO2 21 (L) 22 - 32 mmol/L   Glucose, Bld 83 70 - 99 mg/dL    Comment: Glucose reference range applies only to samples taken after fasting for at least 8 hours.   BUN 22 (H) 6 - 20 mg/dL    Creatinine, Ser 2.72 (H) 0.61 - 1.24 mg/dL   Calcium 8.6 (L) 8.9 - 10.3 mg/dL   GFR, Estimated 53 (L) >60  mL/min    Comment: (NOTE) Calculated using the CKD-EPI Creatinine Equation (2021)    Anion gap 8 5 - 15    Comment: Performed at [facility] Lab, [address] [place], [place] [address]  Glucose, capillary     Status: None   Collection Time: 01/25/23 10:23 PM  Result Value Ref Range   Glucose-Capillary 74 70 - 99 mg/dL    Comment: Glucose reference range applies only to samples taken after fasting for at least 8 hours.  Basic metabolic panel     Status: Abnormal   Collection Time: 01/26/23 11:29 AM  Result Value Ref Range   Sodium 140 135 - 145 mmol/L   Potassium 3.7 3.5 - 5.1 mmol/L   Chloride 112 (H) 98 - 111 mmol/L   CO2 21 (L) 22 - 32 mmol/L   Glucose, Bld 61 (L) 70 - 99 mg/dL    Comment: Glucose reference range applies only to samples taken after fasting for at least 8 hours.   BUN 24 (H) 6 - 20 mg/dL   Creatinine, Ser 6.04 0.61 - 1.24 mg/dL   Calcium 8.5 (L) 8.9 - 10.3 mg/dL   GFR, Estimated >54 >09 mL/min    Comment: (NOTE) Calculated using the CKD-EPI Creatinine Equation (2021)    Anion gap 7 5 - 15    Comment: Performed at [facility], [address], [place], [place] [address]    Current Facility-Administered Medications  Medication Dose Route Frequency Provider Last Rate Last Admin   acetaminophen (TYLENOL) tablet 650 mg  650 mg Oral Q6H PRN Gwenevere Abbot, MD       Or   acetaminophen (TYLENOL) suppository 650 mg  650 mg Rectal Q6H PRN Gwenevere Abbot, MD       enoxaparin (LOVENOX) injection 30 mg  30 mg Subcutaneous Q24H Gwenevere Abbot, MD   30 mg at 01/26/23 1348   LORazepam (ATIVAN) injection 1 mg  1 mg Intravenous Q2H PRN Gwenevere Abbot, MD   1 mg at 01/26/23 0525   senna-docusate (Senokot-S) tablet 1 tablet  1 tablet Oral QHS PRN Gwenevere Abbot, MD        Musculoskeletal:   Psychiatric Specialty Exam:  Presentation  General Appearance:  Other  (comment)  Eye Contact: Minimal  Speech: Clear and Coherent  Speech Volume: Increased  Handedness: Right   Mood and Affect  Mood: Depressed; Anxious; Irritable; Labile  Affect: Labile   Thought Process  Thought Processes: Linear  Descriptions of Associations:Loose  Orientation:Partial  Thought Content:Scattered  History of Schizophrenia/Schizoaffective disorder:No data recorded Duration of Psychotic Symptoms:No data recorded Hallucinations:Hallucinations: None  Ideas of Reference:None  Suicidal Thoughts:Suicidal Thoughts: No  Homicidal Thoughts:Homicidal Thoughts: No   Sensorium  Memory: Recent Fair  Judgment: Poor  Insight: Poor   Executive Functions  Concentration:No data recorded Attention Span: Poor  Recall: Poor  Fund of Knowledge: Fair  Language:No data recorded  Psychomotor Activity  Psychomotor Activity:a  Assets  Assets:No data recorded  Sleep  Sleep:  Physical Exam: Physical Exam Vitals and nursing note reviewed.    Review of Systems  Psychiatric/Behavioral:  The patient is nervous/anxious.    Blood pressure 114/78, pulse (!) 53, temperature 97.7 F (36.5 C), temperature source Axillary, resp. rate 14, SpO2 96%. There is no height or weight on file to calculate BMI.  Treatment Plan Summary: Daily contact with patient to assess and evaluate symptoms and progress in treatment and Medication management  -Benzodiazepines for agitation only  -Orders placed for CK -  2.9 potassium, 2.38 creatinine -3.6 lactic acid - CK 314,  labs collected on 01/25/2023 EKG 354/485- repeated on 01/26/2023 - Pending Ammonia level, consider starting Depakote 500 mg po BID for mood stabilization   Disposition:  Psychiatry to continue to follow  Oneta Rack, NP 01/26/2023 2:29 PM

## 2023-01-26 NOTE — Hospital Course (Addendum)
Substance-induced psychosis  He presented to the ED on 7/18 with acute psychosis. He was found on the street running around naked and screaming about demons and spirits. Police brought him to the hospital because they are concerned about his safety.  He was extremely agitated and violent, so he was sedated with ketamine during transport. On arrival to the ED, he was hemodynamically stable. His urine drug screen was positive for amphetamines. Given his active psychosis, he was IVC'd. For several days in the hospital, he exhibited tangential and disorganized thought processes and speech. He spoke about hearing voices with religious messages at times. His mental status improved each day. By day 4 of hospitalization, his symptoms of psychosis had subsided and he had returned to baseline. At that point, his IVC was lifted, and since his AKI and rhabdomyolysis had also resolved, he was discharged.    AKI, resolved Rhabdomyolysis, resolved On admission, his creatinine was 2.38, up significantly from his baseline ~1. He was started on IV fluids, and his creatinine normalized over the course of a few days. He was also found to have an elevated CK with myoglobinuria. This may have been  secondary to his tensing against his hospital bed restraints, as his CK on admission was 314. Over the course of his stay, his agitation decreased and he was removed from restraints. He received IV fluids and his CK trended down consistently throughout his hospital stay.

## 2023-01-26 NOTE — Plan of Care (Signed)
  Problem: Safety: Goal: Non-violent Restraint(s) Outcome: Progressing   Problem: Clinical Measurements: Goal: Ability to maintain clinical measurements within normal limits will improve Outcome: Progressing Goal: Will remain free from infection Outcome: Progressing Goal: Respiratory complications will improve Outcome: Progressing Goal: Cardiovascular complication will be avoided Outcome: Progressing   Problem: Coping: Goal: Level of anxiety will decrease Outcome: Progressing   Problem: Pain Managment: Goal: General experience of comfort will improve Outcome: Progressing   Problem: Safety: Goal: Ability to remain free from injury will improve Outcome: Progressing   Problem: Skin Integrity: Goal: Risk for impaired skin integrity will decrease Outcome: Progressing

## 2023-01-27 DIAGNOSIS — F15951 Other stimulant use, unspecified with stimulant-induced psychotic disorder with hallucinations: Secondary | ICD-10-CM | POA: Diagnosis not present

## 2023-01-27 DIAGNOSIS — F23 Brief psychotic disorder: Secondary | ICD-10-CM | POA: Diagnosis not present

## 2023-01-27 HISTORY — DX: Other stimulant use, unspecified with stimulant-induced psychotic disorder with hallucinations: F15.951

## 2023-01-27 LAB — BASIC METABOLIC PANEL
Anion gap: 10 (ref 5–15)
BUN: 16 mg/dL (ref 6–20)
CO2: 23 mmol/L (ref 22–32)
Calcium: 8.2 mg/dL — ABNORMAL LOW (ref 8.9–10.3)
Chloride: 105 mmol/L (ref 98–111)
Creatinine, Ser: 0.89 mg/dL (ref 0.61–1.24)
GFR, Estimated: >60 mL/min (ref >60)
Glucose, Bld: 86 mg/dL (ref 70–99)
Potassium: 3.5 mmol/L (ref 3.5–5.1)
Sodium: 138 mmol/L (ref 135–145)

## 2023-01-27 LAB — GLUCOSE, CAPILLARY: Glucose-Capillary: 106 mg/dL — ABNORMAL HIGH (ref 70–99)

## 2023-01-27 LAB — CK
Total CK: 3077 U/L — ABNORMAL HIGH (ref 49–397)
Total CK: 3687 U/L — ABNORMAL HIGH (ref 49–397)

## 2023-01-27 NOTE — Progress Notes (Addendum)
Psych recommending inpatient psych placement. Pt medically stable per attending MD. Per [facility], no bed available today. Will fax pt out to additional inpatient psych facilities and continue to f/u with [facility] re availability.  Dellie Burns, MSW, LCSW [phone] (coverage)

## 2023-01-27 NOTE — Progress Notes (Signed)
HD#2 SUBJECTIVE:  Patient Summary: Ian Roy is a 52 y.o. male with a pertinent PMH of polysubstance use who presented with and is admitted for acute psychosis.   Overnight Events: None  Interim History: Patient was laying in bed comfortably this morning.  He is oriented but still says he cannot recall what brought him to the hospital.  He said he just sold his house and has to get out of the hospital imminently in order to sign some paperwork on a time sensitive deadline.  When we told him that he was not likely to be discharged today, he grew increasingly agitated.  OBJECTIVE:  Vital Signs: Vitals:   01/26/23 1900 01/27/23 0011 01/27/23 0500 01/27/23 0735  BP: (!) 137/112 102/68 104/68 100/83  Pulse: 67 66 (!) 52 (!) 59  Resp: 18 19 18 14   Temp: 98.5 F (36.9 C) 98.7 F (37.1 C) 98.7 F (37.1 C) 97.9 F (36.6 C)  TempSrc: Oral Oral Oral Oral  SpO2: 92% 93% 97% 97%   Supplemental O2: Nasal Cannula SpO2: 97 % O2 Flow Rate (L/min): 2 L/min  There were no vitals filed for this visit.   Intake/Output Summary (Last 24 hours) at 01/27/2023 1100 Last data filed at 01/27/2023 0730 Gross per 24 hour  Intake 2368.52 ml  Output 1400 ml  Net 968.52 ml   Net IO Since Admission: 1,928.45 mL [01/27/23 1100]  Physical Exam: General: Laying in bed comfortably Pulm: normal rate and effort Cardio: normal rate and rhythm Neuro: alert and oriented x 3 Psych: Linear speech, less disorganized than yesterday.  Still very quick to escalate conversation.  Patient Lines/Drains/Airways Status     Active Line/Drains/Airways     Name Placement date Placement time Site Days   Peripheral IV 01/25/23 20 G Anterior;Left;Proximal Forearm 01/25/23  0724  Forearm  1   Peripheral IV 01/25/23 Anterior;Left Forearm 01/25/23  1554  Forearm  1   External Urinary Catheter 01/25/23  0804  --  1            Pertinent Labs:    Latest Ref Rng & Units 01/25/2023    7:34 AM 06/02/2021     1:00 PM 05/04/2020    7:42 PM  CBC  WBC 4.0 - 10.5 K/uL 15.8  15.7  11.9   Hemoglobin 13.0 - 17.0 g/dL 40.9  81.1  91.4   Hematocrit 39.0 - 52.0 % 46.7  49.6  45.7   Platelets 150 - 400 K/uL 193  328  413        Latest Ref Rng & Units 01/27/2023    6:51 AM 01/26/2023   11:29 AM 01/25/2023    5:14 PM  CMP  Glucose 70 - 99 mg/dL 86  61  83   BUN 6 - 20 mg/dL 16  24  22    Creatinine 0.61 - 1.24 mg/dL 7.82  9.56  2.13   Sodium 135 - 145 mmol/L 138  140  141   Potassium 3.5 - 5.1 mmol/L 3.5  3.7  3.7   Chloride 98 - 111 mmol/L 105  112  112   CO2 22 - 32 mmol/L 23  21  21    Calcium 8.9 - 10.3 mg/dL 8.2  8.5  8.6     Recent Labs    01/25/23 2223 01/26/23 1708 01/26/23 2000  GLUCAP 74 91 102*    CK: 6,267 --> 3,077  ASSESSMENT/PLAN:  Assessment: Principal Problem:   Acute psychosis (HCC) Active Problems:   Involuntary  commitment   Altered mental status   AKI (acute kidney injury) (HCC)  Plan: Acute Psychosis Patient continues to act abnormally and his mood remains volatile.  From medical standpoint, he is stable for transfer to behavioral health.  He is currently IVC'd from the ED.  -Psychiatry following; appreciate their assistance -Ativan 1mg  q2h PRN for agitation  AKI, resolved Rhabdomyolysis CK was elevated at 6,267 with myoglobinuria. This was likely secondary to fighting against his restraints, as his CK on admission was within normal limits. He is no longer on restraints. Fluids were started and CK down trended to 3,077 today.  Creatinine has normalized at 0.89.  -Hold fluids; continue regular diet  Best Practice: Diet: Regular diet IVF: None VTE: enoxaparin (LOVENOX) injection 30 mg Start: 01/25/23 1400 Code: Full AB: None Therapy Recs: None Family Contact: Braxton, son, to be notified. DISPO: Anticipated discharge Home pending psychiatric and medical stability  Signature: Annett Fabian, MD  Internal Medicine Resident, PGY-1 [facility]  Pager: [phone] 11:00 AM, 01/27/2023   Please contact the on call pager after 5 pm and on weekends at [phone].

## 2023-01-27 NOTE — Progress Notes (Signed)
Patient son Aristide Waggle [phone]) would like to be informed about the next steps in the patients care. Informed his son psych is recommending inpatient pysch placement at this time.

## 2023-01-27 NOTE — Consult Note (Addendum)
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 He is a 52 y/o male with who was admitted due to acute psychosis, agitation, bizarre behavior-running down the road naked screaming about demons and spirits following Amphetamine use(UDS positive for Amphetamine). He seems to be responding better to current treatment but still gets easily agitated, seeing demons and unable to remember the sequence of events that lead to his hospitalization. Patient admits to doing drugs, denies delusions, depression and homicidal ideation, intent or plan. CK level on admission was elevated-6,267 but now trending down to 3,077 -01/27/23  Past Psychiatric History:   Risk to Self:  yes Risk to Others:  denies Prior Inpatient Therapy:  patient unable to remember Prior Outpatient Therapy:  denies  Past Medical History:  Past Medical History:  Diagnosis Date   Alcohol use    daily   Bronchitis    Chronic abdominal pain    GERD (gastroesophageal reflux disease)    Nausea and vomiting    recurrent    Past Surgical History:  Procedure Laterality Date   colonoscopy     COLONOSCOPY WITH PROPOFOL N/A 04/01/2018   Procedure: COLONOSCOPY WITH PROPOFOL;  Surgeon: Corbin Ade, MD;  [facility];  Service: Endoscopy;  Laterality: N/A;  9:15am   None     POLYPECTOMY  04/01/2018   Procedure: POLYPECTOMY;   Surgeon: Corbin Ade, MD;  Location: [facility];  Service: Endoscopy;;   Family History:  Family History  Problem Relation Age of Onset   Cancer Mother        breast   Diabetes Mother    Colon polyps Father    Cancer Maternal Aunt    Colon cancer Neg Hx    Family Psychiatric  History:  Social History:  Social History   Substance and Sexual Activity  Alcohol Use Not Currently   Comment: history heavy etoh. last drink july 2022     Social History   Substance and Sexual Activity  Drug Use Not Currently    Social History   Socioeconomic History   Marital status: Divorced    Spouse name: Not on file   Number of children: Not on file   Years of education: Not on file   Highest education level: Not on file  Occupational History   Occupation: CNA    Comment: prn   Occupation: handyman  Tobacco Use   Smoking status: Every Day    Current packs/day: 1.00    Average packs/day: 1 pack/day for 14.0 years (14.0 ttl pk-yrs)    Types: Cigarettes   Smokeless tobacco: Never  Vaping Use   Vaping status: Never Used  Substance and Sexual Activity   Alcohol use: Not Currently    Comment: history heavy etoh. last drink july 2022   Drug use: Not Currently   Sexual activity: Not on file  Other Topics Concern   Not on file  Social History Narrative   Not on file  Social Determinants of Health   Financial Resource Strain: Not on file  Food Insecurity: Not on file  Transportation Needs: Not on file  Physical Activity: Not on file  Stress: Not on file  Social Connections: Not on file   Additional Social History:    Allergies:  No Known Allergies  Labs:  Results for orders placed or performed during the hospital encounter of 01/25/23 (from the past 48 hour(s))  Rapid urine drug screen (hospital performed)     Status: Abnormal   Collection Time: 01/25/23  2:20 PM  Result Value Ref Range   Opiates NONE DETECTED NONE DETECTED   Cocaine NONE DETECTED NONE DETECTED    Benzodiazepines NONE DETECTED NONE DETECTED   Amphetamines POSITIVE (A) NONE DETECTED   Tetrahydrocannabinol NONE DETECTED NONE DETECTED   Barbiturates NONE DETECTED NONE DETECTED    Comment: (NOTE) DRUG SCREEN FOR MEDICAL PURPOSES ONLY.  IF CONFIRMATION IS NEEDED FOR ANY PURPOSE, NOTIFY LAB WITHIN 5 DAYS.  LOWEST DETECTABLE LIMITS FOR URINE DRUG SCREEN Drug Class                     Cutoff (ng/mL) Amphetamine and metabolites    1000 Barbiturate and metabolites    200 Benzodiazepine                 200 Opiates and metabolites        300 Cocaine and metabolites        300 THC                            50 Performed at [facility], [address], [place], [place] [address]   Urinalysis, Routine w reflex microscopic -Urine, Clean Catch     Status: Abnormal   Collection Time: 01/25/23  2:20 PM  Result Value Ref Range   Color, Urine AMBER (A) YELLOW    Comment: BIOCHEMICALS MAY BE AFFECTED BY COLOR   APPearance HAZY (A) CLEAR   Specific Gravity, Urine 1.027 1.005 - 1.030   pH 5.0 5.0 - 8.0   Glucose, UA NEGATIVE NEGATIVE mg/dL   Hgb urine dipstick MODERATE (A) NEGATIVE   Bilirubin Urine SMALL (A) NEGATIVE   Ketones, ur 5 (A) NEGATIVE mg/dL   Protein, ur 284 (A) NEGATIVE mg/dL   Nitrite NEGATIVE NEGATIVE   Leukocytes,Ua NEGATIVE NEGATIVE   RBC / HPF 0-5 0 - 5 RBC/hpf   WBC, UA 0-5 0 - 5 WBC/hpf   Bacteria, UA NONE SEEN NONE SEEN   Squamous Epithelial / HPF 0-5 0 - 5 /HPF   Mucus PRESENT    Hyaline Casts, UA PRESENT    Sperm, UA PRESENT     Comment: Performed at [facility], [address] [place], [place] [address]  HIV Antibody (routine testing w rflx)     Status: None   Collection Time: 01/25/23  5:14 PM  Result Value Ref Range   HIV Screen 4th Generation wRfx Non Reactive Non Reactive    Comment: Performed at [facility], [address], [place], [place] [address]  TSH     Status: None   Collection Time: 01/25/23  5:14 PM  Result Value Ref Range    TSH 0.839 0.350 - 4.500 uIU/mL    Comment: Performed by a 3rd Generation assay with a functional sensitivity of <=0.01 uIU/mL. Performed at [facility], [address], [place], [place] [address]  Basic metabolic panel     Status: Abnormal   Collection Time: 01/25/23  5:14 PM  Result Value Ref Range   Sodium 141 135 - 145 mmol/L   Potassium 3.7 3.5 - 5.1 mmol/L   Chloride 112 (H) 98 - 111 mmol/L   CO2 21 (L) 22 - 32 mmol/L   Glucose, Bld 83 70 - 99 mg/dL    Comment: Glucose reference range applies only to samples taken after fasting for at least 8 hours.   BUN 22 (H) 6 - 20 mg/dL   Creatinine, Ser 9.14 (H) 0.61 - 1.24 mg/dL   Calcium 8.6 (L) 8.9 - 10.3 mg/dL   GFR, Estimated 53 (L) >60 mL/min    Comment: (NOTE) Calculated using the CKD-EPI Creatinine Equation (2021)    Anion gap 8 5 - 15    Comment: Performed at [facility], [address] [place], [place] [address]  Glucose, capillary     Status: None   Collection Time: 01/25/23 10:23 PM  Result Value Ref Range   Glucose-Capillary 74 70 - 99 mg/dL    Comment: Glucose reference range applies only to samples taken after fasting for at least 8 hours.  Basic metabolic panel     Status: Abnormal   Collection Time: 01/26/23 11:29 AM  Result Value Ref Range   Sodium 140 135 - 145 mmol/L   Potassium 3.7 3.5 - 5.1 mmol/L   Chloride 112 (H) 98 - 111 mmol/L   CO2 21 (L) 22 - 32 mmol/L   Glucose, Bld 61 (L) 70 - 99 mg/dL    Comment: Glucose reference range applies only to samples taken after fasting for at least 8 hours.   BUN 24 (H) 6 - 20 mg/dL   Creatinine, Ser 6.21 0.61 - 1.24 mg/dL   Calcium 8.5 (L) 8.9 - 10.3 mg/dL   GFR, Estimated >30 >86 mL/min    Comment: (NOTE) Calculated using the CKD-EPI Creatinine Equation (2021)    Anion gap 7 5 - 15    Comment: Performed at [facility], [address] [place], [place] [address]  CK     Status: Abnormal   Collection Time: 01/26/23 11:29 AM  Result Value Ref  Range   Total CK 6,267 (H) 49 - 397 U/L    Comment: RESULTS CONFIRMED BY MANUAL DILUTION Performed at [facility], [address], [place], [place] [address]   Glucose, capillary     Status: None   Collection Time: 01/26/23  5:08 PM  Result Value Ref Range   Glucose-Capillary 91 70 - 99 mg/dL    Comment: Glucose reference range applies only to samples taken after fasting for at least 8 hours.  CK     Status: Abnormal   Collection Time: 01/26/23  5:48 PM  Result Value Ref Range   Total CK 4,483 (H) 49 - 397 U/L    Comment: RESULT CONFIRMED BY MANUAL DILUTION Performed at [facility], [address], [place], [place] [address]   Glucose, capillary     Status: Abnormal   Collection Time: 01/26/23  8:00 PM  Result Value Ref Range   Glucose-Capillary 102 (H) 70 - 99 mg/dL    Comment: Glucose reference range applies only to samples taken after fasting for at least 8 hours.   Comment 1 Notify RN    Comment 2 Document in Chart   CK     Status: Abnormal   Collection Time: 01/26/23 11:59 PM  Result Value Ref Range   Total CK 3,687 (H) 49 - 397 U/L    Comment: Performed at [facility], [address] [place], [place] [address]  Basic metabolic panel     Status: Abnormal   Collection Time: 01/27/23  6:51 AM  Result Value Ref Range   Sodium 138 135 - 145 mmol/L   Potassium 3.5 3.5 - 5.1 mmol/L   Chloride 105 98 - 111 mmol/L   CO2 23 22 - 32 mmol/L   Glucose, Bld 86 70 - 99 mg/dL    Comment: Glucose reference range applies only to samples taken after fasting for at least 8 hours.   BUN 16 6 - 20 mg/dL   Creatinine, Ser 1.91 0.61 - 1.24 mg/dL   Calcium 8.2 (L) 8.9 - 10.3 mg/dL   GFR, Estimated >47 >82 mL/min    Comment: (NOTE) Calculated using the CKD-EPI Creatinine Equation (2021)    Anion gap 10 5 - 15    Comment: Performed at [facility], [address] [place], [place] [address]  CK     Status: Abnormal   Collection Time: 01/27/23  6:51 AM  Result  Value Ref Range   Total CK 3,077 (H) 49 - 397 U/L    Comment: Performed at [facility], [address], [place], [place] [address]    Current Facility-Administered Medications  Medication Dose Route Frequency Provider Last Rate Last Admin   acetaminophen (TYLENOL) tablet 650 mg  650 mg Oral Q6H PRN Gwenevere Abbot, MD       Or   acetaminophen (TYLENOL) suppository 650 mg  650 mg Rectal Q6H PRN Gwenevere Abbot, MD       enoxaparin (LOVENOX) injection 30 mg  30 mg Subcutaneous Q24H Gwenevere Abbot, MD   30 mg at 01/26/23 1348   LORazepam (ATIVAN) injection 1 mg  1 mg Intravenous Q2H PRN Gwenevere Abbot, MD   1 mg at 01/26/23 0525   senna-docusate (Senokot-S) tablet 1 tablet  1 tablet Oral QHS PRN Gwenevere Abbot, MD        Musculoskeletal:   Psychiatric Specialty Exam:  Presentation  General Appearance:  Other (comment)  Eye Contact: Minimal  Speech: Clear and Coherent  Speech Volume: Increased  Handedness: Right   Mood and Affect  Mood: Depressed; Anxious; Irritable; Labile  Affect: Labile   Thought Process  Thought Processes: Linear  Descriptions of Associations:Loose  Orientation:Partial  Thought Content:Scattered  History of Schizophrenia/Schizoaffective disorder:No data recorded Duration of Psychotic Symptoms:No data recorded Hallucinations:Hallucinations: None  Ideas of Reference:None  Suicidal Thoughts:Suicidal Thoughts: No  Homicidal Thoughts:Homicidal Thoughts: No   Sensorium  Memory: Recent Fair  Judgment: Poor  Insight: Poor   Executive Functions  Concentration:No data recorded Attention Span: Poor  Recall: Poor  Fund of Knowledge: Fair  Language:No data recorded  Psychomotor Activity  Psychomotor Activity:a  Assets  Assets:No data recorded  Sleep  Sleep:  Physical Exam: Physical Exam Vitals and nursing note reviewed.    Review of Systems  Psychiatric/Behavioral:  Positive for hallucinations and substance abuse.  The patient is nervous/anxious.    Blood pressure 100/83, pulse (!) 59, temperature 97.9 F (36.6 C), temperature source Oral, resp. rate 14, SpO2 97%. There is no height or weight on file to calculate BMI.  Treatment Plan Summary: Daily contact with patient to assess and evaluate symptoms and progress in treatment and Medication management   Plan/Recommendations: -Benzodiazepines for agitation only  -Do not start patient on Depakote  because it can cause an elevated level of CK -Consider Haldol 2 mg po daily for agitation and psychosis if CK level is below 300 -Blood CK level daily -Consider TOC/social worker consult to facilitate inpatient psych admission after patient is medically stable  Disposition: Recommend psychiatric Inpatient admission when medically cleared. Supportive therapy provided about ongoing stressors. Psychiatry to continue to follow  Thedore Mins, MD 01/27/2023 1:32 PM

## 2023-01-28 DIAGNOSIS — F15951 Other stimulant use, unspecified with stimulant-induced psychotic disorder with hallucinations: Secondary | ICD-10-CM | POA: Diagnosis not present

## 2023-01-28 LAB — GLUCOSE, CAPILLARY
Glucose-Capillary: 136 mg/dL — ABNORMAL HIGH (ref 70–99)
Glucose-Capillary: 93 mg/dL (ref 70–99)
Glucose-Capillary: 103 mg/dL — ABNORMAL HIGH (ref 70–99)

## 2023-01-28 LAB — CK: Total CK: 1769 U/L — ABNORMAL HIGH (ref 49–397)

## 2023-01-28 NOTE — Progress Notes (Signed)
   HD#3 SUBJECTIVE:  Patient Summary: Ian Roy is a 52 y.o. male with a pertinent PMH of polysubstance use who presented with and is admitted for acute psychosis.   Overnight Events: No acute events overnight  Interim History: [name] was evaluated at the bedside this morning. He is resting comfortably and has no acute concerns, other than leaving the hospital as soon as possible. Still exhibits tangential and disorganized thought processes.  OBJECTIVE:  Vital Signs: Vitals:   01/27/23 1550 01/27/23 1957 01/27/23 2343 01/28/23 0344  BP: 106/67 122/73 110/81 118/74  Pulse: 70 92 (!) 55 (!) 52  Resp: 19 16 18 13   Temp: 98.4 F (36.9 C) 98.2 F (36.8 C) 97.8 F (36.6 C) 98.4 F (36.9 C)  TempSrc: Oral Oral Oral Oral  SpO2: 97% 92% 97% 100%   Supplemental O2: Room Air SpO2: 100 % O2 Flow Rate (L/min): 2 L/min  There were no vitals filed for this visit.   Intake/Output Summary (Last 24 hours) at 01/28/2023 0616 Last data filed at 01/27/2023 1700 Gross per 24 hour  Intake 1200 ml  Output --  Net 1200 ml   Net IO Since Admission: 2,648.45 mL [01/28/23 0616]  Physical Exam: General: No acute distress, comfortably resting in bed Pulmonary: Normal effort.  Extremities: Normal bulk and tone, normal ROM Skin: Warm and dry.  Neuro: A&Ox3. Unaware of situation/why he is in hospital. Psych:  Slightly disorganized/scattered speech, tangential thought processes. Increased speech volume and is anxious.   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 He is recommended to transfer to inpatient psych as soon as a bed becomes available. Followed up with social work and psychiatry to see if he can transfer to [facility] today, but no bed is available.  - Ativan 1 mg q2h PRN for anxiety, agitation  AKI, resolved Rhabdomyolysis, resolved CK has downtrended to 1k and Cr is within normal limits. Patient is medically stable for discharge to inpatient psych. No more IVF needed.    Best Practice: Diet: Regular diet IVF: Fluids: none VTE: enoxaparin (LOVENOX) injection 30 mg Start: 01/25/23 1400 Code: Full AB: None Family Contact: son, to be notified. DISPO: Anticipated discharge tomorrow to  [facility]  pending  bed availability .  Signature: Elza Rafter, D.O.  Internal Medicine Resident, PGY-3 [facility]  Pager: [phone] 6:16 AM, 01/28/2023   Please contact the on call pager after 5 pm and on weekends at [phone].

## 2023-01-29 DIAGNOSIS — F15951 Other stimulant use, unspecified with stimulant-induced psychotic disorder with hallucinations: Secondary | ICD-10-CM | POA: Diagnosis not present

## 2023-01-29 LAB — BASIC METABOLIC PANEL
Anion gap: 11 (ref 5–15)
BUN: 13 mg/dL (ref 6–20)
CO2: 25 mmol/L (ref 22–32)
Calcium: 8.9 mg/dL (ref 8.9–10.3)
Chloride: 102 mmol/L (ref 98–111)
Creatinine, Ser: 0.95 mg/dL (ref 0.61–1.24)
GFR, Estimated: >60 mL/min (ref >60)
Glucose, Bld: 112 mg/dL — ABNORMAL HIGH (ref 70–99)
Potassium: 3.8 mmol/L (ref 3.5–5.1)
Sodium: 138 mmol/L (ref 135–145)

## 2023-01-29 LAB — CK: Total CK: 1069 U/L — ABNORMAL HIGH (ref 49–397)

## 2023-01-29 LAB — GLUCOSE, CAPILLARY
Glucose-Capillary: 98 mg/dL (ref 70–99)
Glucose-Capillary: 122 mg/dL — ABNORMAL HIGH (ref 70–99)

## 2023-01-29 NOTE — TOC Progression Note (Signed)
Transition of Care [facility]) - Progression Note    Patient Details  Name: Ian Roy MRN: [id number] Date of Birth: 1970/09/19  [facility] CM/SW Contact  Eduard Roux, [place] Phone Number: 01/29/2023, 11:38 AM  Clinical Narrative:     [facility] states no bed availability  No other bed offers at this time  TOC will continue to follow and assist with discharge planning.  Antony Blackbird, MSW, LCSW Clinical Social Worker         Expected Discharge Plan and Services                                               Social Determinants of Health (SDOH) Interventions SDOH Screenings   Tobacco Use: High Risk (07/01/2021)    Readmission Risk Interventions     No data to display

## 2023-01-29 NOTE — TOC Progression Note (Signed)
Transition of Care [facility]) - Progression Note    Patient Details  Name: Ian Roy MRN: [id number] Date of Birth: September 28, 1970  Transition of Care [facility]) CM/SW Contact  Eduard Roux, [place] Phone Number: 01/29/2023, 4:14 PM  Clinical Narrative:     ICV rescinded  Case number:[id number]  envelope # [id number]  Antony Blackbird, MSW, LCSW Clinical Social Worker         Expected Discharge Plan and Services         Expected Discharge Date: 01/29/23                                     Social Determinants of Health (SDOH) Interventions SDOH Screenings   Tobacco Use: High Risk (07/01/2021)    Readmission Risk Interventions     No data to display

## 2023-01-29 NOTE — Consult Note (Signed)
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 I hope they let me go. I dont know how those drugs got in my system but this happened before.''    Objective: Patient seen and evaluated face-to-face by this provider. He is a 52 y/o male with who was admitted due to acute psychosis, agitation, bizarre behavior-running down the road naked screaming about demons and spirits following Amphetamine use(UDS positive for Amphetamine).   TOMER CHALMERS is a 52 y.o. Yo male polysubstance use disorder having trialed multiple substance use rehabilitation programs in the past, charted psychiatric diagnoses substance induced psychosis with no history of inpatient psychiatric hospitalization. Psychiatry was consulted due to report hallucinations. Sheppard Coil  reported seeing demons, and hallucinating that resulted in him running down the street naked.  On initial psychiatric evaluation, mental status exam is significant for psychosis and hallucinations, delusions and irritability.  On today's evaluation he is alert and oriented x 4, and observed to be having an appropriate conversation with his mother.  He expresses self-criticism related to continued substance use and demonstrates insight into the deleterious effects of substance use. He is able to identify strengths such as determination and motivation. He reports he recently completed rehab in 2020, and has not engaged in illicit use  since then. He admits to isolated episode of methamphetamine while hanging out with old friends. Current acute risk for suicide is low given lack of current suicidal ideation and future-oriented thinking, so suicide precautions and 1:1 are not needed. Most likely underlying diagnosis is substance-induced mood disorder, but further diagnostic clarity is difficult to ascertain given recent substance use and historical uncertainties (e.g., whether he has truly had mood episodes during periods of sobriety). Though patient has had multiple past psychiatric medication trials and feels that he does not need any at discharge he reports he is open to outpatient therapy and resources. Will therefore hold off on recommending any additional psychiatric medications until further diagnostic clarity is established and patient feels that adherence will be sustainable.    While future psychiatric events cannot be accurately predicted, the patient does not currently require acute inpatient psychiatric care and does not currently meet [facility] involuntary commitment criteria.    Attempted to call his son per nurses request unsuccessful. Patient did provide consent to speak with both son and mother. Provider successfully reached Emilie Rutter at [phone]. She did not endorse any concerns or safety concerns that would prohibit his discharge. She mentions her safety and level of comfort of driving at night time, and inquires about discharge.   Past Psychiatric History: Polysubstance use, substance induced psychosis. Has completed inpatient rehab. Pt denies ever been hospitalized for mental health concerns in the past. Denies any previous history of suicidal thoughts, suicidal ideations, and or non suicidal self injurious behaviors. Pt denies history of aggression, agitation, violent behavior, and or history of homicidal ideations/thoughts.  Patient further denies any current, previous legal charges.  Patient further  denies access to guns, weapons, or any engagement with the legal system.  Risk to Self:  Denies Risk to Others:  denies Prior Inpatient Therapy:  Denies Prior Outpatient Therapy:  denies  Past Medical History:  Past Medical History:  Diagnosis Date   Alcohol use    daily   Bronchitis    Chronic abdominal pain    GERD (gastroesophageal reflux disease)    Nausea and vomiting    recurrent    Past Surgical History:  Procedure Laterality Date   colonoscopy     COLONOSCOPY WITH PROPOFOL N/A 04/01/2018   Procedure: COLONOSCOPY WITH PROPOFOL;  Surgeon: Corbin Ade, MD;  [facility];  Service: Endoscopy;  Laterality: N/A;  9:15am   None     POLYPECTOMY  04/01/2018   Procedure: POLYPECTOMY;  Surgeon: Corbin Ade, MD;  Location: [facility];  Service: Endoscopy;;   Family History:  Family History  Problem Relation Age of Onset   Cancer Mother        breast   Diabetes Mother    Colon polyps Father    Cancer Maternal Aunt    Colon cancer Neg Hx    Family Psychiatric  History:  Social History:  Social History   Substance and Sexual Activity  Alcohol Use Not Currently   Comment: history heavy etoh. last drink july 2022     Social History   Substance and Sexual Activity  Drug Use Not Currently    Social History   Socioeconomic History   Marital status: Divorced    Spouse name: Not on file   Number of children: Not on file   Years of education: Not on file   Highest education level: Not on file  Occupational History   Occupation: CNA    Comment: prn   Occupation: handyman  Tobacco Use   Smoking status: Every Day    Current packs/day: 1.00    Average packs/day: 1 pack/day for 14.0 years (14.0 ttl pk-yrs)    Types: Cigarettes   Smokeless tobacco: Never  Vaping Use   Vaping status: Never Used  Substance and Sexual Activity   Alcohol use: Not Currently    Comment: history heavy etoh. last drink july 2022   Drug use: Not Currently   Sexual  activity: Not on file  Other Topics Concern   Not on file  Social History Narrative   Not on file   Social Determinants of Health   Financial Resource Strain: Not on file  Food Insecurity: Not on file  Transportation Needs: Not on file  Physical Activity: Not on file  Stress: Not on file  Social Connections: Not on file   Additional Social History:    Allergies:  No Known Allergies  Labs:  Results for orders placed or performed during the hospital encounter of 01/25/23 (from the past 48 hour(s))  Glucose, capillary     Status: Abnormal   Collection Time: 01/27/23  8:54 PM  Result Value Ref Range   Glucose-Capillary 106 (H) 70 - 99 mg/dL    Comment: Glucose reference range applies only to samples taken after fasting for at least 8 hours.   Comment 1 Notify RN    Comment 2 Document in Chart   CK     Status: Abnormal   Collection Time: 01/28/23  7:18 AM  Result Value Ref Range   Total CK 1,769 (H) 49 - 397 U/L    Comment: Performed at [facility], [address] [place], [place] [address]  Glucose, capillary     Status: Abnormal  Collection Time: 01/28/23 12:33 PM  Result Value Ref Range   Glucose-Capillary 136 (H) 70 - 99 mg/dL    Comment: Glucose reference range applies only to samples taken after fasting for at least 8 hours.   Comment 1 Notify RN   Glucose, capillary     Status: None   Collection Time: 01/28/23  4:28 PM  Result Value Ref Range   Glucose-Capillary 93 70 - 99 mg/dL    Comment: Glucose reference range applies only to samples taken after fasting for at least 8 hours.   Comment 1 Notify RN   Glucose, capillary     Status: Abnormal   Collection Time: 01/28/23  9:11 PM  Result Value Ref Range   Glucose-Capillary 103 (H) 70 - 99 mg/dL    Comment: Glucose reference range applies only to samples taken after fasting for at least 8 hours.   Comment 1 Notify RN    Comment 2 Document in Chart   CK     Status: Abnormal   Collection Time: 01/29/23   1:46 AM  Result Value Ref Range   Total CK 1,069 (H) 49 - 397 U/L    Comment: Performed at [facility], [address] [place], [place] [address]  Basic metabolic panel     Status: Abnormal   Collection Time: 01/29/23  1:46 AM  Result Value Ref Range   Sodium 138 135 - 145 mmol/L   Potassium 3.8 3.5 - 5.1 mmol/L   Chloride 102 98 - 111 mmol/L   CO2 25 22 - 32 mmol/L   Glucose, Bld 112 (H) 70 - 99 mg/dL    Comment: Glucose reference range applies only to samples taken after fasting for at least 8 hours.   BUN 13 6 - 20 mg/dL   Creatinine, Ser 6.04 0.61 - 1.24 mg/dL   Calcium 8.9 8.9 - 54.0 mg/dL   GFR, Estimated >98 >11 mL/min    Comment: (NOTE) Calculated using the CKD-EPI Creatinine Equation (2021)    Anion gap 11 5 - 15    Comment: Performed at [facility], [address], [place], [place] [address]  Glucose, capillary     Status: None   Collection Time: 01/29/23  7:28 AM  Result Value Ref Range   Glucose-Capillary 98 70 - 99 mg/dL    Comment: Glucose reference range applies only to samples taken after fasting for at least 8 hours.  Glucose, capillary     Status: Abnormal   Collection Time: 01/29/23 11:22 AM  Result Value Ref Range   Glucose-Capillary 122 (H) 70 - 99 mg/dL    Comment: Glucose reference range applies only to samples taken after fasting for at least 8 hours.    Current Facility-Administered Medications  Medication Dose Route Frequency Provider Last Rate Last Admin   acetaminophen (TYLENOL) tablet 650 mg  650 mg Oral Q6H PRN Gwenevere Abbot, MD       Or   acetaminophen (TYLENOL) suppository 650 mg  650 mg Rectal Q6H PRN Gwenevere Abbot, MD       enoxaparin (LOVENOX) injection 30 mg  30 mg Subcutaneous Q24H Gwenevere Abbot, MD   30 mg at 01/29/23 1318   LORazepam (ATIVAN) injection 1 mg  1 mg Intravenous Q2H PRN Gwenevere Abbot, MD   1 mg at 01/26/23 0525   senna-docusate (Senokot-S) tablet 1 tablet  1 tablet Oral QHS PRN Gwenevere Abbot, MD         Musculoskeletal:   Psychiatric Specialty  Exam:  Presentation  General Appearance:  Appropriate for Environment; Casual  Eye Contact: Good  Speech: Clear and Coherent; Normal Rate  Speech Volume: Increased  Handedness: Right   Mood and Affect  Mood: Anxious; Euphoric  Affect: Appropriate; Congruent   Thought Process  Thought Processes: Coherent; Linear  Descriptions of Associations:Intact  Orientation:Full (Time, Place and Person)  Thought Content:Logical  History of Schizophrenia/Schizoaffective disorder:No data recorded Duration of Psychotic Symptoms:No data recorded Hallucinations:Hallucinations: None  Ideas of Reference:None  Suicidal Thoughts:Suicidal Thoughts: No  Homicidal Thoughts:Homicidal Thoughts: No   Sensorium  Memory: Immediate Good; Recent Good; Remote Good  Judgment: Fair  Insight: Good   Executive Functions  Concentration:Good  Attention Span: Good  Recall: Good  Fund of Knowledge: Good  Language:Good   Psychomotor Activity  Psychomotor Activity:a  Assets  Assets:Communication Skills; Desire for Improvement; Financial Resources/Insurance; Housing; Social Support   Sleep  Sleep:  Physical Exam: Physical Exam Vitals and nursing note reviewed.  Constitutional:      Appearance: Normal appearance. He is normal weight.  Skin:    General: Skin is warm.     Capillary Refill: Capillary refill takes less than 2 seconds.  Neurological:     General: No focal deficit present.     Mental Status: He is alert and oriented to person, place, and time. Mental status is at baseline.  Psychiatric:        Mood and Affect: Mood normal.        Behavior: Behavior normal.        Thought Content: Thought content normal.        Judgment: Judgment normal.    Review of Systems  Psychiatric/Behavioral:  Positive for hallucinations and substance abuse. The patient is nervous/anxious.    Blood pressure 109/71, pulse (!)  54, temperature 98.1 F (36.7 C), temperature source Oral, resp. rate 16, SpO2 96%. There is no height or weight on file to calculate BMI.  Treatment Plan Summary: Plan     Plan/Recommendations: -Will update AVS to reflect outpatient resources. --DC Recruitment consultant --Rescind IVC. Patient no longer danger to self.   Disposition: No evidence of imminent risk to self or others at present.   Patient does not meet criteria for psychiatric inpatient admission. Supportive therapy provided about ongoing stressors. Discussed crisis plan, support from social network, calling 911, coming to the Emergency Department, and calling Suicide Hotline. Psychiatry to sign off at this time.  Maryagnes Amos, FNP 01/29/2023 2:55 PM

## 2023-01-29 NOTE — Discharge Summary (Addendum)
Name: Ian Roy MRN: [id number] DOB: 07/02/1971 52 y.o. PCP: Pcp, No  Date of Admission: 01/25/2023  7:14 AM Date of Discharge:  01/29/2023 Attending Physician: Dr. 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     DISPOSITION AND FOLLOW-UP:  Ian Roy was discharged from [facility] in Stable condition. At the hospital follow up visit please address:  Substance-induced psychosis: He was hospitalized for symptoms of psychosis in the setting of recent amphetamine use.  Continue to counsel him regarding substance use and encourage cessation. Ensure he follows up with outpatient psych (referral placed).   Follow-up Recommendations: Consults: outpatient psychiatry Labs: None Studies: None Medications: None  Follow-up Appointments: Follow up appointment at the [facility] on 8/13 at 8:45 AM.  Follow-up Information     [facility]. Go on 02/20/2023.   Specialty: Internal Medicine Why: You have an appointment scheduled with a primary care provider on 8/13 at 8:45 AM. Contact information: [address] [place] [address] [phone]                HOSPITAL COURSE:  Patient Summary: Substance-induced psychosis  He presented to the ED on 7/18 with acute psychosis. He was found on the street running around naked and screaming about demons and spirits. Police brought him to the hospital because they are  concerned about his safety.  He was extremely agitated and violent, so he was sedated with ketamine during transport. On arrival to the ED, he was hemodynamically stable. His urine drug screen was positive for amphetamines. Given his active psychosis, he was IVC'd. For several days in the hospital, he exhibited tangential and disorganized thought processes and speech. He spoke about hearing voices with religious messages at times. His mental status improved each day. By day 4 of hospitalization, his symptoms of psychosis had subsided and he had returned to baseline. Psychiatry evaluated and there was no further indication for inpatient psych admission. At that point, his IVC was lifted, and since his AKI and rhabdomyolysis had also resolved, he was discharged.    AKI, resolved Rhabdomyolysis, resolved On admission, his creatinine was 2.38, up significantly from his baseline ~1. He was started on IV fluids, and his creatinine normalized over the course of a few days. He was also found to have an elevated CK with myoglobinuria. This may have been  secondary to his tensing against his hospital bed restraints, as his CK on admission was 314. Over the course of his stay, his agitation decreased and he was removed from restraints. He received IV fluids and his CK trended down consistently throughout his hospital stay.     DISCHARGE INSTRUCTIONS:   Discharge Instructions     Ambulatory referral to Psychiatry   Complete by: As directed    Call MD for:  difficulty breathing, headache or visual disturbances   Complete by: As directed    Call MD for:  extreme fatigue   Complete by: As directed    Call MD for:  persistant dizziness or light-headedness   Complete by: As directed    Call MD for:  persistant nausea and vomiting   Complete by: As directed    Call MD for:  severe uncontrolled pain   Complete by: As directed    Diet general   Complete by: As directed    Discharge instructions   Complete by:  As directed    It was a pleasure taking care of you at [facility]  You were admitted for acute psychosis in the setting of recent amphetamine use. You were subsequently found to have an acute kidney injury and an elevated Creatine Kinase, which indicates muscle inflammation. These have both resolved. You were treated with IV fluids and monitored as your lab values improved and returned to normal. Your symptoms of psychosis have subsided and you were deemed stable for discharge.   We have scheduled you a hospital follow-up appointment with a primary care provider in our general internal medicine clinic on August 13th at 8:45 AM. In the meantime, if you have any questions, please call us at [phone].   Increase activity slowly   Complete by: As directed        SUBJECTIVE:   He was evaluated this morning at bedside. He was coherent, his speech was clear and organized. He was aware of his situation. His mood seemed more stable and he was not agitated like the past few days.  Discharge Vitals:   BP 109/71 (BP Location: Right Arm)   Pulse (!) 54   Temp 98.1 F (36.7 C) (Oral)   Resp 16   SpO2 96%   OBJECTIVE:   Physical Exam:  General: laying in bed comfortably, sleeping Pulm: normal rate and effort Cardio: regular rate and rhythm Neuro: oriented x 3 Psych: stable mood, no evident signs of psychosis, awareness of events leading up to hospitalization  Pertinent Labs, Studies, and Procedures:     Latest Ref Rng & Units 01/25/2023    7:34 AM 06/02/2021    1:00 PM 05/04/2020    7:42 PM  CBC  WBC 4.0 - 10.5 K/uL 15.8  15.7  11.9   Hemoglobin 13.0 - 17.0 g/dL 09.8  11.9  14.7   Hematocrit 39.0 - 52.0 % 46.7  49.6  45.7   Platelets 150 - 400 K/uL 193  328  413        Latest Ref Rng & Units 01/29/2023    1:46 AM 01/27/2023    6:51 AM 01/26/2023   11:29 AM  CMP  Glucose 70 - 99 mg/dL 829  86  61   BUN 6 - 20 mg/dL 13  16  24    Creatinine 0.61 - 1.24 mg/dL 5.62   1.30  8.65   Sodium 135 - 145 mmol/L 138  138  140   Potassium 3.5 - 5.1 mmol/L 3.8  3.5  3.7   Chloride 98 - 111 mmol/L 102  105  112   CO2 22 - 32 mmol/L 25  23  21    Calcium 8.9 - 10.3 mg/dL 8.9  8.2  8.5    CK: 7,846  MR BRAIN WO CONTRAST Result Date: 01/25/2023 IMPRESSION: 1. Significantly motion degraded examination. 2. Within this limitation, there is no evidence of an acute intracranial abnormality. The diffusion-weighted imaging is of good quality and there is no evidence of an acute infarct. 3. Possible punctate chronic microhemorrhage within the left cerebellar hemisphere. 4. Minor paranasal sinus disease as described. Electronically Signed  By: Jackey Loge D.O.   On: 01/25/2023 11:02   DG Abdomen 1 View Result Date: 01/25/2023 CLINICAL DATA:  Evaluation for MRI EXAM: ABDOMEN - 1 VIEW COMPARISON:  CT abdomen and pelvis dated 01/08/2018 FINDINGS: The left lateral most abdomen and bilateral most and inferior pelvis are not included within the field of view. Nonobstructive bowel gas pattern. Partially imaged curvilinear radiodensity projecting over the lateral right iliac wing is likely external to the patient. IMPRESSION: 1. Partially imaged curvilinear radiodensity projecting over the lateral right iliac wing is likely external to the patient and would be amenable to direct inspection. 2. Otherwise, no unexpected metallic foreign body within the imaged abdomen and pelvis. Electronically Signed   By: Agustin Cree M.D.   On: 01/25/2023 10:08   CT HEAD WO CONTRAST ( ) Result Date: 01/25/2023 IMPRESSION: Possible age-indeterminate infarct in the posterior limb of the left internal capsule. Consider brain MRI for further evaluation. Electronically Signed   By: Lesia Hausen M.D.   On: 01/25/2023 09:36   DG Chest Portable 1 View Result Date: 01/25/2023 IMPRESSION: Low lung volumes without evidence of acute cardiopulmonary disease. Electronically Signed   By: Orvan Falconer M.D.   On:  01/25/2023 08:14    Signed:  Annett Fabian, MD Internal Medicine Resident, PGY-1 [facility]  Pager: [phone] 1:59 PM, 01/29/2023

## 2023-01-29 NOTE — Progress Notes (Signed)
Discharge instructions reviewed with patient utilizing teach back method no questions at this time. Patient discharged to home. ?

## 2023-02-20 ENCOUNTER — Encounter: Payer: Self-pay | Admitting: Student

## 2023-03-05 ENCOUNTER — Ambulatory Visit (INDEPENDENT_AMBULATORY_CARE_PROVIDER_SITE_OTHER): Payer: 59 | Admitting: Student

## 2023-03-05 ENCOUNTER — Encounter: Payer: Self-pay | Admitting: Student

## 2023-03-05 VITALS — BP 106/72 | HR 75 | Temp 98.2°F | Resp 20 | Ht 72.0 in | Wt 218.7 lb

## 2023-03-05 DIAGNOSIS — Z8739 Personal history of other diseases of the musculoskeletal system and connective tissue: Secondary | ICD-10-CM | POA: Diagnosis not present

## 2023-03-05 DIAGNOSIS — Z1211 Encounter for screening for malignant neoplasm of colon: Secondary | ICD-10-CM

## 2023-03-05 DIAGNOSIS — R3915 Urgency of urination: Secondary | ICD-10-CM

## 2023-03-05 DIAGNOSIS — E781 Pure hyperglyceridemia: Secondary | ICD-10-CM | POA: Diagnosis not present
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
## 2023-03-05 NOTE — Assessment & Plan Note (Signed)
Patient does have history of polyps that were removed back in 2019.  It is recommended that he return back for colonoscopy in 5 years.  It has been 5 years.  Will refer patient for colonoscopy.  Plan: Referral to colonoscopy

## 2023-03-05 NOTE — Assessment & Plan Note (Signed)
Patient reports she has been having this chronic issue with urgency with urination.  He states that he notices that at random times, unexpectedly he gets a very sudden urge to void.  He states that he has to void immediately or else he will become incontinent.  He states it has gotten to the point where he now has to carry a cup with him to make sure that if it happens, he has somewhere to void.  He denies any recent sexual encounters.  He states he has not been sexually active for the past 2 years.  He denies any penile discharge.  He denies any concern for STIs.  This is chronic in nature, so I think infection is less likely.  Will obtain UA to further evaluate.  Given chronicity, would like to refer to urology to see if maybe patient needs procedure for this.  Patient did have PSA about 2 years ago which was normal.  Could be related to BPH.  Plan: -Obtain UA -Refer to urology

## 2023-03-05 NOTE — Assessment & Plan Note (Signed)
Patient has medical history of hypertriglyceridemia.  This is likely secondary to alcohol use.  Most recent triglyceride levels have been 182.  Will repeat levels today.  Given history of hypertriglyceridemia, will also obtain A1c.  Patient does have family history of A1c.  Will evaluate for overall cardiovascular risk.  Plan: -Obtain lipid panel -Obtains A1c

## 2023-03-05 NOTE — Patient Instructions (Addendum)
Ian Roy, Signs you for allowing me to take part in your care today.  Here are your instructions.  1.  Regarding your urinary symptoms, I am referring you to urology.  I am also checking your urine.  I will call you with results.  2.  I am checking your diabetes as well as high cholesterol.  I will call you with results.  3.  I am glad that you are doing well.  I am checking your CK levels to make sure that they have normalized.  4.  If you have any concerns, you can always call the office or call the on-call provider by calling the operator at the hospital and ask for the resident on-call.  5.  I have referred you for a colonoscopy.  Please await phone call to be scheduled.  Thank you, Dr. Allena Katz  If you have any other questions please contact the internal medicine clinic at [phone]

## 2023-03-06 LAB — LIPID PANEL
Chol/HDL Ratio: 4.8 ratio (ref 0.0–5.0)
Cholesterol, Total: 214 mg/dL — ABNORMAL HIGH (ref 100–199)
HDL: 45 mg/dL (ref >39)
LDL Chol Calc (NIH): 118 mg/dL — ABNORMAL HIGH (ref 0–99)
Triglycerides: 293 mg/dL — ABNORMAL HIGH (ref 0–149)
VLDL Cholesterol Cal: 51 mg/dL — ABNORMAL HIGH (ref 5–40)

## 2023-03-06 LAB — URINALYSIS, ROUTINE W REFLEX MICROSCOPIC
Bilirubin, UA: NEGATIVE
Glucose, UA: NEGATIVE
Ketones, UA: NEGATIVE
Leukocytes,UA: NEGATIVE
Nitrite, UA: NEGATIVE
Protein,UA: NEGATIVE
RBC, UA: NEGATIVE
Specific Gravity, UA: 1.023 (ref 1.005–1.030)
Urobilinogen, Ur: 0.2 mg/dL (ref 0.2–1.0)
pH, UA: 6 (ref 5.0–7.5)

## 2023-03-06 LAB — HEMOGLOBIN A1C
Est. average glucose Bld gHb Est-mCnc: 128 mg/dL
Hgb A1c MFr Bld: 6.1 % — ABNORMAL HIGH (ref 4.8–5.6)

## 2023-03-06 LAB — CK: Total CK: 63 U/L (ref 41–331)

## 2023-03-06 NOTE — Progress Notes (Signed)
Internal Medicine Clinic Attending  Case discussed with the resident at the time of the visit.  We reviewed the resident's history and exam and pertinent patient test results.  I agree with the assessment, diagnosis, and plan of care documented in the resident's note.  

## 2023-03-06 NOTE — Addendum Note (Signed)
Addended by: Burnell Blanks on: 03/06/2023 09:59 AM   Modules accepted: Level of Service

## 2023-03-07 ENCOUNTER — Encounter (INDEPENDENT_AMBULATORY_CARE_PROVIDER_SITE_OTHER): Payer: Self-pay | Admitting: *Deleted

## 2023-06-05 ENCOUNTER — Encounter: Payer: 59 | Admitting: Student

## 2023-06-26 ENCOUNTER — Encounter: Payer: 59 | Admitting: Student

## 2023-07-13 ENCOUNTER — Encounter: Payer: 59 | Admitting: Student

## 2023-07-13 NOTE — Progress Notes (Deleted)
 Established Patient Office Visit  Subjective   Patient ID: Ian Roy, male    DOB: 1970-12-12  Age: 53 y.o. MRN: [id number]  No chief complaint on file.  HPI This is a 53 year old male living with a history stated below and presents today for ***. Please see problem based assessment and plan for additional details.   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   Bronchitis    Chronic abdominal pain    Cigarette nicotine dependence without complication 11/02/2015   GERD (gastroesophageal reflux disease)    Nausea and vomiting    recurrent   Past Surgical History:  Procedure Laterality Date   colonoscopy     COLONOSCOPY WITH PROPOFOL  N/A 04/01/2018   Procedure: COLONOSCOPY WITH PROPOFOL ;  Surgeon: Shaaron Lamar HERO, MD;  Location: [facility];  Service: Endoscopy;  Laterality: N/A;  9:15am   None     POLYPECTOMY  04/01/2018   Procedure: POLYPECTOMY;  Surgeon: Shaaron Lamar HERO, MD;  Location: [facility];  Service: Endoscopy;;   Social History   Tobacco Use   Smoking status: Every Day    Current packs/day: 1.00    Average packs/day: 1 pack/day for 14.0 years (14.0 ttl pk-yrs)    Types: Cigarettes   Smokeless tobacco: Never  Vaping Use   Vaping status: Never Used  Substance Use Topics   Alcohol use: Not Currently    Comment: history heavy etoh. last drink july 2022   Drug use: Not Currently   Social History   Socioeconomic History   Marital status: Divorced    Spouse name: Not on file   Number of children: Not on file   Years of education: Not on file    Highest education level: Not on file  Occupational History   Occupation: CNA    Comment: prn   Occupation: handyman  Tobacco Use   Smoking status: Every Day    Current packs/day: 1.00    Average packs/day: 1 pack/day for 14.0 years (14.0 ttl pk-yrs)    Types: Cigarettes   Smokeless tobacco: Never  Vaping Use   Vaping status: Never Used  Substance and Sexual Activity   Alcohol use: Not Currently    Comment: history heavy etoh. last drink july 2022   Drug use: Not Currently   Sexual activity: Not on file  Other Topics Concern   Not on file  Social History Narrative   Not on file   Social Drivers of Health   Financial Resource Strain: Not on file  Food Insecurity: No Food Insecurity (03/05/2023)   Hunger Vital Sign    Worried About Running Out of Food in the Last Year: Never true    Ran Out of Food in the Last Year: Never true  Transportation Needs: No Transportation Needs (03/05/2023)   PRAPARE - Administrator, Civil Service (Medical): No    Lack of Transportation (Non-Medical): No  Physical Activity: Not on file  Stress: Not on file  Social Connections: Not on file  Intimate Partner Violence: Not At Risk (03/05/2023)   Humiliation, Afraid, Rape,  and Kick questionnaire    Fear of Current or Ex-Partner: No    Emotionally Abused: No    Physically Abused: No    Sexually Abused: No   Family Status  Relation Name Status   Mother  Alive   Brother  Alive   Father  Alive   Sister  Alive   MGM  Alive   MGF  Deceased   PGM  Deceased   PGF  Deceased   Mat Aunt  (Not Specified)   Neg Hx  (Not Specified)  No partnership data on file   Family History  Problem Relation Age of Onset   Cancer Mother        breast   Diabetes Mother    Colon polyps Father    Cancer Maternal Aunt    Colon cancer Neg Hx    No Known Allergies  ROS   ROS negative except for what is noted on the assessment and plan Objective:     There were no vitals taken for this visit. BP  Readings from Last 3 Encounters:  03/05/23 106/72  01/29/23 109/71  07/01/21 97/60   Wt Readings from Last 3 Encounters:  03/05/23 218 lb 11.2 oz (99.2 kg)  07/01/21 205 lb (93 kg)  06/13/21 205 lb (93 kg)   SpO2 Readings from Last 3 Encounters:  03/05/23 98%  01/29/23 96%  06/13/21 97%      Physical Exam   No results found for any visits on 07/13/23.  Last CBC Lab Results  Component Value Date   WBC 15.8 (H) 01/25/2023   HGB 15.9 01/25/2023   HCT 46.7 01/25/2023   MCV 88.3 01/25/2023   MCH 30.1 01/25/2023   RDW 12.8 01/25/2023   PLT 193 01/25/2023   Last metabolic panel Lab Results  Component Value Date   GLUCOSE 112 (H) 01/29/2023   NA 138 01/29/2023   K 3.8 01/29/2023   CL 102 01/29/2023   CO2 25 01/29/2023   BUN 13 01/29/2023   CREATININE 0.95 01/29/2023   GFRNONAA >60 01/29/2023   CALCIUM 8.9 01/29/2023   PROT 7.5 01/25/2023   ALBUMIN 4.4 01/25/2023   BILITOT 0.8 01/25/2023   ALKPHOS 52 01/25/2023   AST 27 01/25/2023   ALT 28 01/25/2023   ANIONGAP 11 01/29/2023   Last lipids Lab Results  Component Value Date   CHOL 214 (H) 03/05/2023   HDL 45 03/05/2023   LDLCALC 118 (H) 03/05/2023   TRIG 293 (H) 03/05/2023   CHOLHDL 4.8 03/05/2023   Last hemoglobin A1c Lab Results  Component Value Date   HGBA1C 6.1 (H) 03/05/2023   Last thyroid functions Lab Results  Component Value Date   TSH 0.839 01/25/2023       The 10-year ASCVD risk score (Arnett DK, et al., 2019) is: 7.6%    Assessment & Plan:   Problem List Items Addressed This Visit   None   No follow-ups on file.    Toma Edwards, DO

## 2023-08-23 ENCOUNTER — Emergency Department (HOSPITAL_BASED_OUTPATIENT_CLINIC_OR_DEPARTMENT_OTHER)
Admission: EM | Admit: 2023-08-23 | Discharge: 2023-08-23 | Disposition: A | Discharge status: Home / Self Care | Payer: 59 | Attending: Emergency Medicine | Admitting: Emergency Medicine

## 2023-08-23 ENCOUNTER — Emergency Department (HOSPITAL_BASED_OUTPATIENT_CLINIC_OR_DEPARTMENT_OTHER): Payer: 59

## 2023-08-23 ENCOUNTER — Encounter (HOSPITAL_BASED_OUTPATIENT_CLINIC_OR_DEPARTMENT_OTHER): Payer: Self-pay | Admitting: Emergency Medicine

## 2023-08-23 ENCOUNTER — Other Ambulatory Visit: Payer: Self-pay

## 2023-08-23 DIAGNOSIS — R1084 Generalized abdominal pain: Secondary | ICD-10-CM | POA: Insufficient documentation

## 2023-08-23 DIAGNOSIS — G8929 Other chronic pain: Secondary | ICD-10-CM

## 2023-08-23 DIAGNOSIS — E871 Hypo-osmolality and hyponatremia: Secondary | ICD-10-CM

## 2023-08-23 DIAGNOSIS — K2920 Alcoholic gastritis without bleeding: Secondary | ICD-10-CM | POA: Insufficient documentation

## 2023-08-23 DIAGNOSIS — F1020 Alcohol dependence, uncomplicated: Secondary | ICD-10-CM | POA: Diagnosis not present

## 2023-08-23 DIAGNOSIS — R112 Nausea with vomiting, unspecified: Secondary | ICD-10-CM | POA: Diagnosis not present

## 2023-08-23 DIAGNOSIS — F1010 Alcohol abuse, uncomplicated: Secondary | ICD-10-CM

## 2023-08-23 LAB — COMPREHENSIVE METABOLIC PANEL
ALT: 34 U/L (ref 0–44)
AST: 25 U/L (ref 15–41)
Albumin: 4.3 g/dL (ref 3.5–5.0)
Alkaline Phosphatase: 43 U/L (ref 38–126)
Anion gap: 13 (ref 5–15)
BUN: 18 mg/dL (ref 6–20)
CO2: 24 mmol/L (ref 22–32)
Calcium: 9.2 mg/dL (ref 8.9–10.3)
Chloride: 90 mmol/L — ABNORMAL LOW (ref 98–111)
Creatinine, Ser: 1.13 mg/dL (ref 0.61–1.24)
GFR, Estimated: >60 mL/min (ref >60)
Glucose, Bld: 108 mg/dL — ABNORMAL HIGH (ref 70–99)
Potassium: 3.3 mmol/L — ABNORMAL LOW (ref 3.5–5.1)
Sodium: 127 mmol/L — ABNORMAL LOW (ref 135–145)
Total Bilirubin: 1.2 mg/dL (ref 0.0–1.2)
Total Protein: 7.7 g/dL (ref 6.5–8.1)

## 2023-08-23 LAB — BASIC METABOLIC PANEL
Anion gap: 9 (ref 5–15)
BUN: 16 mg/dL (ref 6–20)
CO2: 28 mmol/L (ref 22–32)
Calcium: 8.7 mg/dL — ABNORMAL LOW (ref 8.9–10.3)
Chloride: 94 mmol/L — ABNORMAL LOW (ref 98–111)
Creatinine, Ser: 0.94 mg/dL (ref 0.61–1.24)
GFR, Estimated: >60 mL/min (ref >60)
Glucose, Bld: 118 mg/dL — ABNORMAL HIGH (ref 70–99)
Potassium: 3.3 mmol/L — ABNORMAL LOW (ref 3.5–5.1)
Sodium: 131 mmol/L — ABNORMAL LOW (ref 135–145)

## 2023-08-23 LAB — LIPASE, BLOOD: Lipase: 19 U/L (ref 11–51)

## 2023-08-23 LAB — CBC
HCT: 49.3 % (ref 39.0–52.0)
Hemoglobin: 17.4 g/dL — ABNORMAL HIGH (ref 13.0–17.0)
MCH: 30.7 pg (ref 26.0–34.0)
MCHC: 35.3 g/dL (ref 30.0–36.0)
MCV: 86.9 fL (ref 80.0–100.0)
Platelets: 438 10*3/uL — ABNORMAL HIGH (ref 150–400)
RBC: 5.67 MIL/uL (ref 4.22–5.81)
RDW: 14 % (ref 11.5–15.5)
WBC: 14.7 10*3/uL — ABNORMAL HIGH (ref 4.0–10.5)
nRBC: 0 % (ref 0.0–0.2)

## 2023-08-23 LAB — URINALYSIS, ROUTINE W REFLEX MICROSCOPIC
Bilirubin Urine: NEGATIVE
Glucose, UA: NEGATIVE mg/dL
Ketones, ur: NEGATIVE mg/dL
Leukocytes,Ua: NEGATIVE
Nitrite: NEGATIVE
Protein, ur: NEGATIVE mg/dL
Specific Gravity, Urine: 1.02 (ref 1.005–1.030)
pH: 6.5 (ref 5.0–8.0)

## 2023-08-23 LAB — CK: Total CK: 45 U/L — ABNORMAL LOW (ref 49–397)

## 2023-08-23 LAB — LACTIC ACID, PLASMA: Lactic Acid, Venous: 1.2 mmol/L (ref 0.5–1.9)

## 2023-08-23 LAB — URINALYSIS, MICROSCOPIC (REFLEX)

## 2023-08-23 MED ORDER — ONDANSETRON HCL 4 MG PO TABS: 4.0000 mg | ORAL_TABLET | ORAL | 0 refills | Status: DC | PRN

## 2023-08-23 MED ORDER — ONDANSETRON HCL 4 MG/2ML IJ SOLN
4.0000 mg | Freq: Once | INTRAMUSCULAR | Status: AC | PRN
  Administered 2023-08-23: 4 mg via INTRAVENOUS
  Filled 2023-08-23: qty 2

## 2023-08-23 MED ORDER — SUCRALFATE 1 G PO TABS
1.0000 g | ORAL_TABLET | Freq: Once | ORAL | Status: AC
  Administered 2023-08-23: 1 g via ORAL
  Filled 2023-08-23: qty 1

## 2023-08-23 MED ORDER — ONDANSETRON HCL 4 MG/2ML IJ SOLN
4.0000 mg | Freq: Once | INTRAMUSCULAR | Status: AC
  Administered 2023-08-23: 4 mg via INTRAVENOUS
  Filled 2023-08-23: qty 2

## 2023-08-23 MED ORDER — OXYCODONE-ACETAMINOPHEN 5-325 MG PO TABS
1.0000 | ORAL_TABLET | Freq: Once | ORAL | Status: AC
  Administered 2023-08-23: 1 via ORAL
  Filled 2023-08-23: qty 1

## 2023-08-23 MED ORDER — SODIUM CHLORIDE 0.9 % IV BOLUS
1000.0000 mL | Freq: Once | INTRAVENOUS | Status: AC
  Administered 2023-08-23: 1000 mL via INTRAVENOUS

## 2023-08-23 MED ORDER — IOHEXOL 300 MG/ML  SOLN
100.0000 mL | Freq: Once | INTRAMUSCULAR | Status: AC | PRN
  Administered 2023-08-23: 100 mL via INTRAVENOUS

## 2023-08-23 MED ORDER — SUCRALFATE 1 G PO TABS: 1.0000 g | ORAL_TABLET | Freq: Three times a day (TID) | ORAL | 0 refills | Status: AC

## 2023-08-23 MED ORDER — PANTOPRAZOLE SODIUM 20 MG PO TBEC: 20.0000 mg | DELAYED_RELEASE_TABLET | Freq: Every day | ORAL | 0 refills | Status: AC

## 2023-08-23 MED ORDER — PANTOPRAZOLE SODIUM 40 MG IV SOLR
80.0000 mg | Freq: Once | INTRAVENOUS | Status: AC
  Administered 2023-08-23: 80 mg via INTRAVENOUS
  Filled 2023-08-23: qty 20

## 2023-08-23 MED ORDER — METOCLOPRAMIDE HCL 5 MG/ML IJ SOLN
10.0000 mg | Freq: Once | INTRAMUSCULAR | Status: AC
  Administered 2023-08-23: 10 mg via INTRAVENOUS
  Filled 2023-08-23: qty 2

## 2023-08-23 MED ORDER — HYDROMORPHONE HCL 1 MG/ML IJ SOLN
1.0000 mg | Freq: Once | INTRAMUSCULAR | Status: AC
  Administered 2023-08-23: 1 mg via INTRAVENOUS
  Filled 2023-08-23: qty 1

## 2023-08-23 MED ORDER — OXYCODONE HCL 5 MG PO TABS: 5.0000 mg | ORAL_TABLET | ORAL | 0 refills | Status: DC | PRN

## 2023-08-23 NOTE — ED Notes (Signed)
Pt tolerating fluid and food. Denies pain or nausea.

## 2023-08-23 NOTE — ED Provider Notes (Signed)
Provider Note MRN:  [id number]  Arrival date & time: 08/23/23    ED Course and Medical Decision Making  Assumed care from Dr Blinda Leatherwood at shift change.  See note from prior team for complete details, in brief:  53 year old male history of chronic alcohol abuse, here with nausea vomiting abdominal pain. Had some streaks of blood in his vomit and some dark stools He was given antiemetic, PPI, fluids, analgesia  Plan per prior physician follow-up labs and imaging, recheck  Labs reviewed, he has hyponatremia 127, sodium is 138 months ago.  He has some leukocytosis, likely secondary to vomiting.  Urine without ketones or evidence of infection.   Patient was seen walking out of the emergency department, reports that he "needed some privacy"  12:30 PM Pt returned to the building, reports that he went out to smoke a cigarette  Labs imaging reviewed, sodium has improved, he is tolerant p.o. the difficulty.  Symptoms have improved. Patient likely has alcoholic gastritis, chronic abdominal pain.  Encouraged him to abstain from alcohol and tobacco use.  Follow-up with gastroenterology for endoscopic evaluation.  Start PPI and Carafate.  Antiemetic for home.  Give short course of analgesia for home.  The patient improved significantly and was discharged in stable condition. Detailed discussions were had with the patient/guardian regarding current findings, and need for close f/u with PCP or on call doctor. The patient/guardian has been instructed to return immediately if the symptoms worsen in any way for re-evaluation. Patient/guardian verbalized understanding and is in agreement with current care plan. All questions answered prior to discharge.     Counseled patient for approximately 3 minutes regarding smoking cessation. Discussed risks of smoking and how they applied and affected their visit here today. Patient not ready to quit at this time, however will follow up with their primary doctor when  they are.   CPT code: 40981: intermediate counseling for smoking cessation        Procedures  Final Clinical Impressions(s) / ED Diagnoses     ICD-10-CM   1. Hyponatremia  E87.1     2. Nausea and vomiting, unspecified vomiting type  R11.2     3. Chronic alcohol abuse  F10.10     4. Chronic alcoholic gastritis, presence of bleeding unspecified  K29.20     5. Chronic abdominal pain  R10.9    G89.29       ED Discharge Orders          Ordered    pantoprazole (PROTONIX) 20 MG tablet  Daily        08/23/23 1227    sucralfate (CARAFATE) 1 g tablet  3 times daily with meals        08/23/23 1227    ondansetron (ZOFRAN) 4 MG tablet  Every 4 hours PRN        08/23/23 1227    oxyCODONE (ROXICODONE) 5 MG immediate release tablet  Every 4 hours PRN        08/23/23 1227              Discharge Instructions      Recommend abstinence from alcohol and tobacco.  Recommend you call gastroenterology to arrange follow-up, you will likely benefit from endoscopy.  Recommend you follow-up with PCP for recheck of your sodium and symptoms in the next 3 to 5 days.  It was a pleasure caring for you today in the emergency department.  Please return to the emergency department for any worsening or worrisome symptoms.  Tanda Rockers A, DO 08/23/23 1230

## 2023-08-23 NOTE — ED Triage Notes (Signed)
Pt Hx of Etoh for 10+ years and quit in 2021, had a beer on Saturday and started emesis on Sunday. States has been seen multiple times and scans are clear. Was told needs an endo for possible ulcers.

## 2023-08-23 NOTE — ED Provider Notes (Signed)
[facility] AT [facility] [place] Provider Note   CSN: [id number] Arrival date & time: 08/23/23  0540     History  No chief complaint on file.   Ian Roy is a 53 y.o. male.  Patient presents to the emergency department for evaluation of abdominal pain with nausea and vomiting.  Patient reports that pain has progressively worsened.  He has noticed some amount of blood mixed with his vomit and has been passing some dark stools.  Patient reports a history of recurrent abdominal pain with similar symptoms in the past.  This has been related to alcohol intake.  He has had very large workup that did not show any source for his pain, ultimately was told he had an ulcer but never got endoscopy.  Patient does report that he has slowly been increasing his alcohol intake over the last few months.  He will drink several times a month, up to four 40s at a time.       Home Medications Prior to Admission medications   Not on File      Allergies    Patient has no known allergies.    Review of Systems   Review of Systems  Physical Exam Updated Vital Signs BP (!) 125/91   Pulse 75   Temp 98.3 F (36.8 C) (Oral)   Ht 6' (1.829 m)   Wt 93 kg   SpO2 100%   BMI 27.80 kg/m  Physical Exam Vitals and nursing note reviewed.  Constitutional:      General: He is in acute distress (Rocking in the bed).     Appearance: He is well-developed.  HENT:     Head: Normocephalic and atraumatic.     Mouth/Throat:     Mouth: Mucous membranes are moist.  Eyes:     General: Vision grossly intact. Gaze aligned appropriately.     Extraocular Movements: Extraocular movements intact.     Conjunctiva/sclera: Conjunctivae normal.  Cardiovascular:     Rate and Rhythm: Normal rate and regular rhythm.     Pulses: Normal pulses.     Heart sounds: Normal heart sounds, S1 normal and S2 normal. No murmur heard.    No friction rub. No gallop.  Pulmonary:     Effort:  Pulmonary effort is normal. No respiratory distress.     Breath sounds: Normal breath sounds.  Abdominal:     Palpations: Abdomen is soft.     Tenderness: There is abdominal tenderness (Diffuse). There is no guarding or rebound.     Hernia: No hernia is present.  Musculoskeletal:        General: No swelling.     Cervical back: Full passive range of motion without pain, normal range of motion and neck supple. No pain with movement, spinous process tenderness or muscular tenderness. Normal range of motion.     Right lower leg: No edema.     Left lower leg: No edema.  Skin:    General: Skin is warm and dry.     Capillary Refill: Capillary refill takes less than 2 seconds.     Findings: No ecchymosis, erythema, lesion or wound.  Neurological:     Mental Status: He is alert and oriented to person, place, and time.     GCS: GCS eye subscore is 4. GCS verbal subscore is 5. GCS motor subscore is 6.     Cranial Nerves: Cranial nerves 2-12 are intact.     Sensory: Sensation is intact.  Motor: Motor function is intact. No weakness or abnormal muscle tone.     Coordination: Coordination is intact.  Psychiatric:        Mood and Affect: Mood normal.        Speech: Speech normal.        Behavior: Behavior normal.     ED Results / Procedures / Treatments   Labs (all labs ordered are listed, but only abnormal results are displayed) Labs Reviewed  COMPREHENSIVE METABOLIC PANEL - Abnormal; Notable for the following components:      Result Value   Sodium 127 (*)    Potassium 3.3 (*)    Chloride 90 (*)    Glucose, Bld 108 (*)    All other components within normal limits  CBC - Abnormal; Notable for the following components:   WBC 14.7 (*)    Hemoglobin 17.4 (*)    Platelets 438 (*)    All other components within normal limits  CK - Abnormal; Notable for the following components:   Total CK 45 (*)    All other components within normal limits  LIPASE, BLOOD  URINALYSIS, ROUTINE W REFLEX  MICROSCOPIC  LACTIC ACID, PLASMA    EKG None  Radiology No results found.  Procedures Procedures    Medications Ordered in ED Medications  pantoprazole (PROTONIX) injection 80 mg (has no administration in time range)  ondansetron (ZOFRAN) injection 4 mg (4 mg Intravenous Given 08/23/23 0981)  metoCLOPramide (REGLAN) injection 10 mg (10 mg Intravenous Given 08/23/23 0618)  HYDROmorphone (DILAUDID) injection 1 mg (1 mg Intravenous Given 08/23/23 0618)  sodium chloride 0.9 % bolus 1,000 mL (1,000 mLs Intravenous New Bag/Given 08/23/23 0617)    ED Course/ Medical Decision Making/ A&P                                 Medical Decision Making Amount and/or Complexity of Data Reviewed Labs: ordered.  Risk Prescription drug management.   Differential Diagnosis considered includes, but not limited to: Cholelithiasis; cholecystitis; cholangitis; bowel obstruction; esophagitis; gastritis; peptic ulcer disease; pancreatitis; cardiac.  Presents to the emergency department for evaluation of abdominal pain with nausea and vomiting.  Patient reports persistent vomiting for 4 days.  He did drink prior to onset of the symptoms and has been mildly binging several times a month recently.  He has a history of chronic alcoholism with what sounds like alcoholic gastritis.  Patient appears uncomfortable at arrival.  He was given antiemetics, pain medication IV fluids and Protonix.  Basic labs are starting to return.  He does have a mild leukocytosis.  No anemia, hemoglobin is actually above 17.  Awaiting LFTs, lipase.  Likely will require CT scan and further symptomatic management.  Will sign to oncoming ER physician to follow.        Final Clinical Impression(s) / ED Diagnoses Final diagnoses:  Generalized abdominal pain    Rx / DC Orders ED Discharge Orders     None         Gilda Crease, MD 08/23/23 [phone]

## 2023-08-23 NOTE — ED Notes (Signed)
Pt stated he needed to use the restroom. When entering his room he was getting dressed and I directed him to the restroom. When leaving the bathroom pt stated he needed privacy as he was exiting the ED. I advised him he has an IV and should not leave. He insisted on "needing privacy" and left.

## 2023-08-23 NOTE — Discharge Instructions (Addendum)
Recommend abstinence from alcohol and tobacco.  Recommend you call gastroenterology to arrange follow-up, you will likely benefit from endoscopy.  Recommend you follow-up with PCP for recheck of your sodium and symptoms in the next 3 to 5 days.  It was a pleasure caring for you today in the emergency department.  Please return to the emergency department for any worsening or worrisome symptoms.

## 2023-08-24 ENCOUNTER — Telehealth: Payer: Self-pay

## 2023-08-24 NOTE — Transitions of Care (Post Inpatient/ED Visit) (Signed)
   08/24/2023  Name: Ian Roy MRN: [id number] DOB: 11/10/1970  Today's TOC FU Call Status: Today's TOC FU Call Status:: Successful TOC FU Call Completed TOC FU Call Complete Date: 08/24/23 Patient's Name and Date of Birth confirmed.  Transition Care Management Follow-up Telephone Call Date of Discharge: 08/23/23 Discharge Facility: [facility] Type of Discharge: Emergency Department Reason for ED Visit: Other: (chronic pain) How have you been since you were released from the hospital?: Better Any questions or concerns?: No  Items Reviewed: Did you receive and understand the discharge instructions provided?: Yes Medications obtained,verified, and reconciled?: Yes (Medications Reviewed) Any new allergies since your discharge?: No Dietary orders reviewed?: Yes  Medications Reviewed Today: Medications Reviewed Today     Reviewed by Karena Addison, LPN (Licensed Practical Nurse) on 08/24/23 at 1104  Med List Status: <None>   Medication Order Taking? Sig Documenting Provider Last Dose Status Informant  ondansetron (ZOFRAN) 4 MG tablet [id number]  Take 1 tablet (4 mg total) by mouth every 4 (four) hours as needed for nausea or vomiting. Sloan Leiter, DO  Active   oxyCODONE (ROXICODONE) 5 MG immediate release tablet [id number]  Take 1 tablet (5 mg total) by mouth every 4 (four) hours as needed. Sloan Leiter, DO  Active   pantoprazole (PROTONIX) 20 MG tablet [id number]  Take 1 tablet (20 mg total) by mouth daily for 14 days. Sloan Leiter, DO  Active   sucralfate (CARAFATE) 1 g tablet [id number]  Take 1 tablet (1 g total) by mouth with breakfast, with lunch, and with evening meal for 7 days. Sloan Leiter, DO  Active             Home Care and Equipment/Supplies: Were Home Health Services Ordered?: NA Any new equipment or medical supplies ordered?: NA  Functional Questionnaire: Do you need assistance with bathing/showering or dressing?: No Do you need  assistance with meal preparation?: No Do you need assistance with eating?: No Do you have difficulty maintaining continence: No Do you need assistance with getting out of bed/getting out of a chair/moving?: No Do you have difficulty managing or taking your medications?: No  Follow up appointments reviewed: PCP Follow-up appointment confirmed?: Yes Date of PCP follow-up appointment?: 08/28/23 Follow-up Provider: [facility] Follow-up appointment confirmed?: Yes Date of Specialist follow-up appointment?: 08/28/23 Follow-Up Specialty Provider:: GI Do you need transportation to your follow-up appointment?: No Do you understand care options if your condition(s) worsen?: Yes-patient verbalized understanding    SIGNATURE Karena Addison, LPN [facility] Nurse Health Advisor Direct Dial [phone]

## 2023-08-28 ENCOUNTER — Ambulatory Visit (INDEPENDENT_AMBULATORY_CARE_PROVIDER_SITE_OTHER): Payer: 59 | Admitting: Student

## 2023-08-28 ENCOUNTER — Encounter: Payer: Self-pay | Admitting: Student

## 2023-08-28 VITALS — BP 107/68 | HR 81 | Temp 97.6°F | Ht 72.0 in | Wt 209.8 lb

## 2023-08-28 DIAGNOSIS — F1090 Alcohol use, unspecified, uncomplicated: Secondary | ICD-10-CM | POA: Diagnosis not present

## 2023-08-28 DIAGNOSIS — E871 Hypo-osmolality and hyponatremia: Secondary | ICD-10-CM | POA: Diagnosis not present

## 2023-08-28 DIAGNOSIS — G8929 Other chronic pain: Secondary | ICD-10-CM | POA: Diagnosis not present

## 2023-08-28 DIAGNOSIS — R109 Unspecified abdominal pain: Secondary | ICD-10-CM

## 2023-08-28 DIAGNOSIS — R7303 Prediabetes: Secondary | ICD-10-CM

## 2023-08-28 DIAGNOSIS — R1084 Generalized abdominal pain: Secondary | ICD-10-CM

## 2023-08-28 DIAGNOSIS — Z789 Other specified health status: Secondary | ICD-10-CM | POA: Insufficient documentation

## 2023-08-28 MED ORDER — NALTREXONE HCL 50 MG PO TABS: 50.0000 mg | ORAL_TABLET | Freq: Every day | ORAL | 2 refills | Status: AC

## 2023-08-28 NOTE — Patient Instructions (Signed)
 Thank you, Ian Roy for allowing [place] to provide your care today. Today we discussed your abdominal pain, prediabetes.  I am glad that your stomach pain has improved.  Keep following up with your gastroenterology doctors about scheduling the endoscopy.  We are checking a hemoglobin A1c today given your prediabetes.  I will call you soon as I have the results.  As we discussed, please keep your thumb in a brace for at least 2 weeks, especially at night.  This mobilization should help improve your trigger finger symptoms.  If your symptoms do not improve after several weeks of brace use, please give [place] a call and we can schedule an appointment for further evaluation and treatment.  If you continue to have alcohol cravings, there is a medication called naltrexone that often helps people reduce the cravings and avoid consuming too much alcohol.  Please let us know if you would like [place] to send in a prescription for this medication at any time.  I have ordered the following labs for you:  Lab Orders         BMP8+Anion Gap         Hemoglobin A1c      Follow up: 3 months   We look forward to seeing you next time. Please call our clinic at [phone] if you have any questions or concerns. The best time to call is Monday-Friday from 9am-4pm, but there is someone available 24/7. If after hours or the weekend, call the main hospital number and ask for the Internal Medicine Resident On-Call. If you need medication refills, please notify your pharmacy one week in advance and they will send [place] a request.   Thank you for trusting me with your care. Wishing you the best!   Annett Fabian, MD [facility]

## 2023-08-28 NOTE — Progress Notes (Signed)
    CC: ED follow up for hyponatremia, abdominal pain  HPI:  Ian Roy is a 53 y.o. male with pertinent PMH of alcohol use disorder, hyponatremia, chronic abdominal pain who presents to the clinic for follow up of the recent ED visit. Please see assessment and plan below for further details.  Review of Systems:   Pertinent items noted in HPI and/or A&P.  Physical Exam:  Vitals:   08/28/23 1027 08/28/23 1047  BP: 90/64 107/68  Pulse: 79 81  Temp: 97.6 F (36.4 C)   TempSrc: Oral   SpO2: 100%   Weight: 209 lb 12.8 oz (95.2 kg)   Height: 6' (1.829 m)     Constitutional: Well-appearing, in no acute distress HEENT: Normocephalic, atraumatic, Sclera non-icteric, PERRL, EOM intact Cardio: Regular rate and rhythm, no murmurs Pulm: Clear to auscultation bilaterally. Normal work of breathing on room air. Abdomen: Soft, non-tender, non-distended, positive bowel sounds. MSK: Negative for extremity edema. Skin: Warm and dry. Neuro: Alert and oriented x3. No focal deficit noted. Psych: Pleasant mood and affect.   Assessment & Plan:   Chronic abdominal pain Has a history of abdominal pain for over a year, which occurs intermittently with severe episodes that require the patient to go to the emergency room for pain management.  He feels that these episodes may be related to his alcohol intake, as he recently started drinking again once or twice a week, which corresponds to his recent emergency department visit.  During his ED visit, he was found to be hyponatremic and had a few episodes of hematemesis.  CT scan was unremarkable.  Hyponatremia improved.  His condition improved with symptomatic management.  He has been having dark stools for several months as well.  He is asymptomatic on presentation today. He has been working on scheduling follow-up with the gastroenterologist for an endoscopy.  He has been trying to avoid alcohol as he feels it worsens his abdominal pain.  He has not  been taking his PPI or Carafate, as he feels he does not need them.  I suspect he either has a peptic ulcer or alcohol gastritis.  Will collect a BMP today.  Recommended he be sure to follow-up with GI for an endoscopy.  Alcohol use He used to drink alcohol daily, but says he stopped in 2021.  He has restarted drinking alcohol once or twice a week for the past few months.  However, he has noted that he feels his abdominal pain is worsened by alcohol use, so he would like to stop.  He does note some cravings recently.  He denies using any other substances.  I prescribed him naltrexone 50 mg daily to take to reduce his cravings for alcohol.  He is not currently taking any opioids.  We will continue to encourage and support him and his alcohol cessation.   Patient discussed with Dr. Carleene Overlie, MD [facility] Internal Medicine Resident PGY-1 Clinic Phone: [phone] Pager: [phone]

## 2023-08-28 NOTE — Assessment & Plan Note (Signed)
 He used to drink alcohol daily, but says he stopped in 2021.  He has restarted drinking alcohol once or twice a week for the past few months.  However, he has noted that he feels his abdominal pain is worsened by alcohol use, so he would like to stop.  He does note some cravings recently.  He denies using any other substances.  I prescribed him naltrexone 50 mg daily to take to reduce his cravings for alcohol.  He is not currently taking any opioids.  We will continue to encourage and support him and his alcohol cessation.

## 2023-08-28 NOTE — Assessment & Plan Note (Addendum)
 Has a history of abdominal pain for over a year, which occurs intermittently with severe episodes that require the patient to go to the emergency room for pain management.  He feels that these episodes may be related to his alcohol intake, as he recently started drinking again once or twice a week, which corresponds to his recent emergency department visit.  During his ED visit, he was found to be hyponatremic and had a few episodes of hematemesis.  CT scan was unremarkable.  Hyponatremia improved.  His condition improved with symptomatic management.  He has been having dark stools for several months as well.  He is asymptomatic on presentation today. He has been working on scheduling follow-up with the gastroenterologist for an endoscopy.  He has been trying to avoid alcohol as he feels it worsens his abdominal pain.  He has not been taking his PPI or Carafate, as he feels he does not need them.  I suspect he either has a peptic ulcer or alcohol gastritis.  Will collect a BMP today.  Recommended he be sure to follow-up with GI for an endoscopy.

## 2023-08-29 LAB — BMP8+ANION GAP
Anion Gap: 14 mmol/L (ref 10.0–18.0)
BUN/Creatinine Ratio: 19 (ref 9–20)
BUN: 16 mg/dL (ref 6–24)
CO2: 25 mmol/L (ref 20–29)
Calcium: 9.8 mg/dL (ref 8.7–10.2)
Chloride: 98 mmol/L (ref 96–106)
Creatinine, Ser: 0.85 mg/dL (ref 0.76–1.27)
Glucose: 92 mg/dL (ref 70–99)
Potassium: 4.7 mmol/L (ref 3.5–5.2)
Sodium: 137 mmol/L (ref 134–144)
eGFR: 105 mL/min/{1.73_m2} (ref >59)

## 2023-08-29 LAB — HEMOGLOBIN A1C
Est. average glucose Bld gHb Est-mCnc: 126 mg/dL
Hgb A1c MFr Bld: 6 % — ABNORMAL HIGH (ref 4.8–5.6)

## 2023-08-29 NOTE — Progress Notes (Signed)
 Internal Medicine Clinic Attending  Case discussed with the resident at the time of the visit.  We reviewed the resident's history and exam and pertinent patient test results.  I agree with the assessment, diagnosis, and plan of care documented in the resident's note.

## 2023-08-30 ENCOUNTER — Encounter: Payer: Self-pay | Admitting: Student

## 2023-09-19 ENCOUNTER — Telehealth: Payer: Self-pay | Admitting: *Deleted

## 2023-09-19 DIAGNOSIS — G8929 Other chronic pain: Secondary | ICD-10-CM

## 2023-09-19 NOTE — Telephone Encounter (Signed)
 Communication  Did the patient discuss referral with their provider in the last year? Yes    (If No - schedule appointment)    (If Yes - send message)        Appointment offered? No        Type of order/referral and detailed reason for visit: Gastroenterology referral. Also may need Endoscopy referral done as well.        Preference of office, provider, location: [facility]. Patient is having a hard time finding a gastroenterologist. If Patient can't secure a stomach specialist, he may need Endoscopy order written and supported by clinic.        If referral order, have you been seen by this specialty before? No    (If Yes, this issue or another issue? When? Where?        Can we respond through MyChart? No

## 2023-09-19 NOTE — Telephone Encounter (Signed)
 Yes, the one placed in August has been closed; a new referral is needed.

## 2023-09-26 ENCOUNTER — Encounter: Payer: Self-pay | Admitting: Gastroenterology

## 2023-09-26 ENCOUNTER — Telehealth: Payer: Self-pay | Admitting: Student

## 2023-09-26 NOTE — Telephone Encounter (Signed)
 Copied from CRM [id number]. Topic: Referral - Request for Referral >> Sep 19, 2023  1:09 PM Tiffany H wrote: Did the patient discuss referral with their provider in the last year? Yes (If No - schedule appointment) (If Yes - send message)  Appointment offered? No  Type of order/referral and detailed reason for visit: Gastroenterology referral. Also may need Endoscopy referral done as well.   Preference of office, provider, location: [facility]. Patient is having a hard time finding a gastroenterologist. If Patient can't secure a stomach specialist, he may need Endoscopy order written and supported by clinic.   If referral order, have you been seen by this specialty before? No (If Yes, this issue or another issue? When? Where?  Can we respond through MyChart? No >> Sep 26, 2023  9:51 AM Alcus Dad H wrote: Patient is calling to get an update on referral. He would like a call back.    Called the patient no answer.....  The patient has been referred to [facility] and can call the number listed below f/u on his referral.  [facility] Gastroenterologist in [place], [place] Located in: [place] [facility] [address] Address: [address], [place], [place] [address] Phone: [phone]

## 2023-09-26 NOTE — Telephone Encounter (Signed)
 Copied from CRM [id number]. Topic: Referral - Question >> Sep 26, 2023 12:55 PM Alvino Blood C wrote: Reason for CRM: Patient is needing his most recent referral to be corrected and include "endoscopy"  Per [facility] the Referral is correct and nothin is needed.

## 2023-11-14 ENCOUNTER — Ambulatory Visit: Payer: Self-pay

## 2023-11-14 NOTE — Telephone Encounter (Signed)
 I called pt to see if he needs a sooner appt than 5/19; no answer. Left message on vm sending his message to the doctor and pt to call back if sooner appt needed.

## 2023-11-14 NOTE — Telephone Encounter (Signed)
  Chief Complaint: Trigger Finger to right hand Symptoms: Pain and stiffness Frequency: going on for awhile Pertinent Negatives: Patient denies fever Disposition: [] ED /[] Urgent Care (no appt availability in office) / [] Appointment(In office/virtual)/ []  [facility]/ [] Home Care/ [] Refused Recommended Disposition /[] [place] [facility]/ [x]  Follow-up with PCP Additional Notes: patient calling stating that his trigger finger has gotten worse. Patient endorses pain and stiffness. Endorses taking tylenol  and using a brace. Patient is asking for any other recommendations. Patient is scheduled for an appointment with clinic on 5/19. Patient states he is fine with waiting to be seen but would like any recommendations or possible something stronger for pain. Patient is asking to be called with recommendations. Patient verbalized understanding and all questions answered.    Copied from CRM [id number]. Topic: Clinical - Red Word Triage >> Nov 14, 2023 10:29 AM Danelle Dunning F wrote: [facility] that prompted transfer to Nurse Triage:   Trigger finger is getting worse Reason for Disposition  [1] MODERATE pain (e.g., interferes with normal activities) AND [2] present > 3 days  Answer Assessment - Initial Assessment Questions 1. ONSET: "When did the pain start?"      Patient states he was diagnosed with trigger finger to right hand 2. LOCATION and RADIATION: "Where is the pain located?"  (e.g., fingertip, around nail, joint, entire  finger)      Right middle finger 3. SEVERITY: "How bad is the pain?" "What does it keep you from doing?"   (Scale 1-10; or mild, moderate, severe)  - MILD (1-3): doesn't interfere with normal activities.   - MODERATE (4-7): interferes with normal activities or awakens from sleep.  - SEVERE (8-10): excruciating pain, unable to hold a glass of water or bend finger even a little.     Moderate 4. APPEARANCE: "What does the finger look like?" (e.g., redness, swelling,  bruising, pallor)     normal 5. WORK OR EXERCISE: "Has there been any recent work or exercise that involved this part (i.e., fingers or hand) of the body?"     Patient works with hands 6. CAUSE: "What do you think is causing the pain?"     Works with hands  Protocols used: Finger Pain-A-AH

## 2023-11-19 ENCOUNTER — Telehealth: Payer: Self-pay | Admitting: Student

## 2023-11-19 ENCOUNTER — Encounter: Admitting: Student

## 2023-11-19 NOTE — Progress Notes (Deleted)
   CC: ***  HPI:  Mr.Anthonny E Lader is a 53 y.o. male with a past medical history of alcohol use and chronic abdominal pain who presents for follow-up appointment for prediabetes.  Last A1c on 08/28/2023 was 6.0.  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   Bronchitis    Chronic abdominal pain    Cigarette nicotine dependence without complication 11/02/2015   GERD (gastroesophageal reflux disease)    Nausea and vomiting    recurrent     Current Outpatient Medications:    naltrexone  (DEPADE) 50 MG tablet, Take 1 tablet (50 mg total) by mouth daily., Disp: 30 tablet, Rfl: 2   ondansetron  (ZOFRAN ) 4 MG tablet, Take 1 tablet (4 mg total) by mouth every 4 (four) hours as needed for nausea or vomiting., Disp: 12 tablet, Rfl: 0   oxyCODONE  (ROXICODONE ) 5 MG immediate release tablet, Take 1 tablet (5 mg total) by mouth every 4 (four) hours as needed., Disp: 5 tablet, Rfl: 0   pantoprazole  (PROTONIX ) 20 MG tablet, Take 1 tablet (20 mg total) by mouth daily for 14 days., Disp: 14 tablet, Rfl: 0   sucralfate  (CARAFATE ) 1 g tablet, Take 1 tablet (1 g total) by mouth with breakfast, with lunch, and with evening meal for 7 days., Disp: 21 tablet, Rfl: 0  Review of Systems:  ***  Constitutional: Eye: Respiratory: Cardiovascular: GI: MSK: GU: Skin: Neuro: Endocrine:   Physical Exam:  There were no vitals filed for this visit. *** General: Patient is sitting comfortably in the room  Eyes: Pupils equal and reactive to light, EOM intact  Head: Normocephalic, atraumatic  Neck: Supple, nontender, full range of motion, No JVD Cardio: Regular rate and rhythm, no murmurs, rubs or gallops. 2+ pulses to bilateral upper and lower extremities  Chest: No chest  tenderness Pulmonary: Clear to ausculation bilaterally with no rales, rhonchi, and crackles  Abdomen: Soft, nontender with normoactive bowel sounds with no rebound or guarding  Neuro: Alert and orientated x3. CN II-XII intact. Sensation intact to upper and lower extremities. 2+ patellar reflex.  Back: No midline tenderness, no step off or deformities noted. No paraspinal muscle tenderness.  Skin: No rashes noted  MSK: 5/5 strength to upper and lower extremities.    Assessment & Plan:   No problem-specific Assessment & Plan notes found for this encounter.    Patient {GC/GE:3044014::"discussed with","seen with"} Dr. {NAMES:[id number]::"Guilloud","Hoffman","Mullen","Narendra","Williams","Vincent"}  Jonelle Neri, DO PGY-2 Internal Medicine Resident

## 2023-11-19 NOTE — Telephone Encounter (Signed)
 Patient was contacted today due to no showing his appointment.  No answer, but left detailed message to call the clinic back to reschedule.

## 2023-11-21 ENCOUNTER — Ambulatory Visit: Admitting: Gastroenterology

## 2023-11-21 NOTE — Progress Notes (Deleted)
 HPI : Ian Roy is a 53 y.o. male with a history of alcohol abuse and chronic tobacco use who is referred to us  by Driscilla George, MD for follow-up of chronic abdominal pain and nausea with vomiting.  The patient was seen in the emergency department in February of this year with abdominal pain, nausea and vomiting, found to have hyponatremia, with sodium 127.     Colonoscopy 2019 (Dr. Riley Cheadle) 3 polyps removed, largest 8 mm  1. Colon, polyp(s) - SESSILE SERRATED POLYP (1 OF 1 FRAGMENTS) - NO HIGH GRADE DYSPLASIA OR MALIGNANCY IDENTIFIED 2. 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   Bronchitis    Chronic abdominal pain    Cigarette nicotine dependence without complication 11/02/2015   GERD (gastroesophageal reflux disease)    Nausea and vomiting    recurrent     Past Surgical History:  Procedure Laterality Date   colonoscopy     COLONOSCOPY WITH PROPOFOL  N/A 04/01/2018   Procedure: COLONOSCOPY WITH PROPOFOL ;  Surgeon: Suzette Espy, MD;  [facility];  Service: Endoscopy;  Laterality: N/A;  9:15am   None     POLYPECTOMY  04/01/2018   Procedure: POLYPECTOMY;  Surgeon: Suzette Espy, MD;  Location: [facility];  Service: Endoscopy;;   Family History  Problem Relation Age of Onset   Cancer Mother        breast   Diabetes Mother    Colon polyps Father    Cancer Maternal Aunt    Colon cancer Neg Hx    Social History   Tobacco Use   Smoking status: Every Day    Current packs/day: 1.00    Average packs/day: 1 pack/day for 14.0 years (14.0 ttl pk-yrs)    Types: Cigarettes   Smokeless tobacco: Never  Vaping Use   Vaping status: Never Used  Substance Use Topics   Alcohol use: Not Currently    Comment: history  heavy etoh. last drink july 2022   Drug use: Not Currently   Current Outpatient Medications  Medication Sig Dispense Refill   naltrexone  (DEPADE) 50 MG tablet Take 1 tablet (50 mg total) by mouth daily. 30 tablet 2   ondansetron  (ZOFRAN ) 4 MG tablet Take 1 tablet (4 mg total) by mouth every 4 (four) hours as needed for nausea or vomiting. 12 tablet 0   oxyCODONE  (ROXICODONE ) 5 MG immediate release tablet Take 1 tablet (5 mg total) by mouth every 4 (four) hours as needed. 5 tablet 0   pantoprazole  (PROTONIX ) 20 MG tablet Take 1 tablet (20 mg total) by mouth daily for 14 days. 14 tablet 0   sucralfate  (CARAFATE ) 1 g tablet Take 1 tablet (1 g total) by mouth with breakfast, with lunch, and with evening meal for 7 days. 21 tablet 0   No current facility-administered medications for this visit.   No Known Allergies   Review of Systems: All systems reviewed and negative except where noted in HPI.    No results found.  Physical Exam: There were no vitals taken for this visit. Constitutional: Pleasant,well-developed, ***male in no acute distress. HEENT: Normocephalic and atraumatic. Conjunctivae are normal. No scleral icterus. Neck supple.  Cardiovascular: Normal rate, regular rhythm.  Pulmonary/chest: Effort normal and breath sounds normal. No wheezing, rales or rhonchi.  Abdominal: Soft, nondistended, nontender. Bowel sounds active throughout. There are no masses palpable. No hepatomegaly. Extremities: no edema Lymphadenopathy: No cervical adenopathy noted. Neurological: Alert and oriented to person place and time. Skin: Skin is warm and dry. No rashes noted. Psychiatric: Normal mood and affect. Behavior is normal.  CBC    Component Value Date/Time   WBC 14.7 (H) 08/23/2023 0603   RBC 5.67 08/23/2023 0603   HGB 17.4 (H) 08/23/2023 0603   HCT 49.3 08/23/2023 0603   PLT 438 (H) 08/23/2023 0603   MCV 86.9 08/23/2023 0603   MCH 30.7 08/23/2023 0603   MCHC 35.3 08/23/2023 0603    RDW 14.0 08/23/2023 0603   LYMPHSABS 1.1 01/25/2023 0734   MONOABS 0.8 01/25/2023 0734   EOSABS 0.1 01/25/2023 0734   BASOSABS 0.1 01/25/2023 0734    CMP     Component Value Date/Time   NA 137 08/28/2023 1140   K 4.7 08/28/2023 1140   CL 98 08/28/2023 1140   CO2 25 08/28/2023 1140   GLUCOSE 92 08/28/2023 1140   GLUCOSE 118 (H) 08/23/2023 1005   BUN 16 08/28/2023 1140   CREATININE 0.85 08/28/2023 1140   CREATININE 0.98 12/07/2015 1156   CALCIUM 9.8 08/28/2023 1140   PROT 7.7 08/23/2023 0603   ALBUMIN 4.3 08/23/2023 0603   AST 25 08/23/2023 0603   ALT 34 08/23/2023 0603   ALKPHOS 43 08/23/2023 0603   BILITOT 1.2 08/23/2023 0603   GFRNONAA >60 08/23/2023 1005   GFRAA >60 12/05/2019 2030       Latest Ref Rng & Units 08/23/2023    6:03 AM 01/25/2023    7:34 AM 06/02/2021    1:00 PM  CBC EXTENDED  WBC 4.0 - 10.5 K/uL 14.7  15.8  15.7   RBC 4.22 - 5.81 MIL/uL 5.67  5.29  5.59   Hemoglobin 13.0 - 17.0 g/dL 16.1  09.6  04.5   HCT 39.0 - 52.0 % 49.3  46.7  49.6   Platelets 150 - 400 K/uL 438  193  328   NEUT# 1.7 - 7.7 K/uL  13.6  12.4   Lymph# 0.7 - 4.0 K/uL  1.1  2.2       ASSESSMENT AND PLAN:  Driscilla George, MD

## 2023-11-26 ENCOUNTER — Encounter: Payer: 59 | Admitting: Student

## 2023-12-05 ENCOUNTER — Ambulatory Visit: Payer: Self-pay | Admitting: Student

## 2023-12-05 NOTE — Progress Notes (Deleted)
   CC: ***  HPI:  [name] is a 53 y.o. male with a past medical history of GERD, chronic abdominal pain, alcohol use who presents for follow-up appointment.  Please see assessment and plan for full HPI.  Medications: Alcohol use: Naltrexone  50 mg daily GERD: Carafate  1 g 3 times daily with meals, Protonix  20 mg daily  Patient was last seen in February 2025.  At that time his abdominal pain was addressed.  He states he goes to the emergency department for pain management.  He thinks his episodes are related to alcohol intake.  He had CT scans which were unremarkable.  He states that he had not been taking his PPI or Carafate  at that time. BMP was unremarkable.  A1c was 6.0.  Patient was prescribed naltrexone .  Pneumonia vaccine Tetanus vaccine  Patient had elevated platelet and hemoglobin levels.  Along with leukocytosis.  Has not been followed up.    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   Bronchitis    Chronic abdominal pain    Cigarette nicotine dependence without complication 11/02/2015   GERD (gastroesophageal reflux disease)    Nausea and vomiting    recurrent     Current Outpatient Medications:    naltrexone  (DEPADE) 50 MG tablet, Take 1 tablet (50 mg total) by mouth daily., Disp: 30 tablet, Rfl: 2   ondansetron  (ZOFRAN ) 4 MG tablet, Take 1 tablet (4 mg total) by mouth every 4 (four) hours as needed for nausea or vomiting., Disp: 12 tablet, Rfl: 0   oxyCODONE  (ROXICODONE ) 5 MG immediate release tablet, Take 1 tablet (5 mg total) by mouth every 4 (four) hours as needed., Disp: 5 tablet, Rfl: 0   pantoprazole  (PROTONIX ) 20 MG tablet, Take 1 tablet (20 mg total) by mouth daily for 14 days., Disp: 14 tablet, Rfl: 0   sucralfate  (CARAFATE ) 1 g tablet, Take 1 tablet (1 g total) by mouth with breakfast, with lunch, and with evening meal  for 7 days., Disp: 21 tablet, Rfl: 0  Review of Systems:  ***  Constitutional: Eye: Respiratory: Cardiovascular: GI: MSK: GU: Skin: Neuro: Endocrine:   Physical Exam:  There were no vitals filed for this visit. *** General: Patient is sitting comfortably in the room  Eyes: Pupils equal and reactive to light, EOM intact  Head: Normocephalic, atraumatic  Neck: Supple, nontender, full range of motion, No JVD Cardio: Regular rate and rhythm, no murmurs, rubs or gallops. 2+ pulses to bilateral upper and lower extremities  Chest: No chest tenderness Pulmonary: Clear to ausculation bilaterally with no rales, rhonchi, and crackles  Abdomen: Soft, nontender with normoactive bowel sounds with no rebound or guarding  Neuro: Alert and orientated x3. CN II-XII intact. Sensation intact to upper and lower extremities. 2+ patellar reflex.  Back: No midline tenderness, no step off or deformities noted. No paraspinal muscle tenderness.  Skin: No rashes noted  MSK: 5/5 strength to upper and lower extremities.    Assessment & Plan:   No problem-specific Assessment & Plan notes found for this encounter.    Patient {GC/GE:3044014::"discussed with","seen with"} Dr. {NAMES:[id number]::"Guilloud","Hoffman","Mullen","Narendra","Williams","Vincent"}  Jonelle Neri, DO PGY-2 Internal Medicine Resident

## 2023-12-07 ENCOUNTER — Encounter: Payer: Self-pay | Admitting: Student
